# Patient Record
Sex: Female | Born: 1945 | Race: White | Hispanic: No | Smoking: Former smoker
Health system: Southern US, Community
[De-identification: ages and names within clinical notes are randomized; demographics above are authoritative.]

## PROBLEM LIST (undated history)

## (undated) DIAGNOSIS — C50919 Malignant neoplasm of unspecified site of unspecified female breast: Secondary | ICD-10-CM

## (undated) DIAGNOSIS — D494 Neoplasm of unspecified behavior of bladder: Secondary | ICD-10-CM

## (undated) DIAGNOSIS — C801 Malignant (primary) neoplasm, unspecified: Secondary | ICD-10-CM

## (undated) DIAGNOSIS — M5432 Sciatica, left side: Secondary | ICD-10-CM

## (undated) DIAGNOSIS — M5431 Sciatica, right side: Secondary | ICD-10-CM

## (undated) DIAGNOSIS — F5101 Primary insomnia: Secondary | ICD-10-CM

## (undated) DIAGNOSIS — Z853 Personal history of malignant neoplasm of breast: Secondary | ICD-10-CM

## (undated) DIAGNOSIS — Z9889 Other specified postprocedural states: Secondary | ICD-10-CM

## (undated) DIAGNOSIS — R112 Nausea with vomiting, unspecified: Secondary | ICD-10-CM

## (undated) DIAGNOSIS — K219 Gastro-esophageal reflux disease without esophagitis: Secondary | ICD-10-CM

## (undated) DIAGNOSIS — Z9221 Personal history of antineoplastic chemotherapy: Secondary | ICD-10-CM

## (undated) DIAGNOSIS — H04123 Dry eye syndrome of bilateral lacrimal glands: Secondary | ICD-10-CM

## (undated) DIAGNOSIS — Z8719 Personal history of other diseases of the digestive system: Secondary | ICD-10-CM

## (undated) DIAGNOSIS — Z9109 Other allergy status, other than to drugs and biological substances: Secondary | ICD-10-CM

## (undated) DIAGNOSIS — Z923 Personal history of irradiation: Secondary | ICD-10-CM

## (undated) DIAGNOSIS — N329 Bladder disorder, unspecified: Secondary | ICD-10-CM

## (undated) HISTORY — PX: TONSILLECTOMY: SUR1361

## (undated) HISTORY — PX: CATARACT EXTRACTION W/ INTRAOCULAR LENS  IMPLANT, BILATERAL: SHX1307

## (undated) HISTORY — PX: OTHER SURGICAL HISTORY: SHX169

---
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
## 2006-07-15 DIAGNOSIS — Z853 Personal history of malignant neoplasm of breast: Secondary | ICD-10-CM

## 2006-07-15 HISTORY — PX: BREAST LUMPECTOMY: SHX2

## 2006-07-15 HISTORY — DX: Personal history of malignant neoplasm of breast: Z85.3

## 2006-07-24 ENCOUNTER — Encounter (INDEPENDENT_AMBULATORY_CARE_PROVIDER_SITE_OTHER): Payer: Self-pay | Admitting: Radiology

## 2006-07-24 ENCOUNTER — Encounter: Admission: RE | Admit: 2006-07-24 | Discharge: 2006-07-24 | Payer: Self-pay | Admitting: Family Medicine

## 2006-07-24 ENCOUNTER — Encounter (INDEPENDENT_AMBULATORY_CARE_PROVIDER_SITE_OTHER): Payer: Self-pay | Admitting: Specialist

## 2006-08-01 ENCOUNTER — Encounter: Admission: RE | Admit: 2006-08-01 | Discharge: 2006-08-01 | Payer: Self-pay | Admitting: Family Medicine

## 2006-08-11 ENCOUNTER — Encounter: Admission: RE | Admit: 2006-08-11 | Discharge: 2006-08-11 | Payer: Self-pay | Admitting: Surgery

## 2006-08-13 ENCOUNTER — Encounter: Admission: RE | Admit: 2006-08-13 | Discharge: 2006-08-13 | Payer: Self-pay | Admitting: Surgery

## 2006-08-13 ENCOUNTER — Encounter (INDEPENDENT_AMBULATORY_CARE_PROVIDER_SITE_OTHER): Payer: Self-pay | Admitting: Specialist

## 2006-08-13 ENCOUNTER — Ambulatory Visit (HOSPITAL_BASED_OUTPATIENT_CLINIC_OR_DEPARTMENT_OTHER): Admission: RE | Admit: 2006-08-13 | Discharge: 2006-08-13 | Payer: Self-pay | Admitting: Surgery

## 2006-08-13 HISTORY — PX: BREAST LUMPECTOMY WITH NEEDLE LOCALIZATION AND AXILLARY SENTINEL LYMPH NODE BX: SHX5760

## 2006-08-27 ENCOUNTER — Ambulatory Visit: Payer: Self-pay | Admitting: Oncology

## 2006-09-03 LAB — CANCER ANTIGEN 27.29: CA 27.29: 35 U/mL (ref 0–39)

## 2006-09-03 LAB — CBC WITH DIFFERENTIAL/PLATELET
BASO%: 0.4 % (ref 0.0–2.0)
Eosinophils Absolute: 0.3 10*3/uL (ref 0.0–0.5)
LYMPH%: 29.6 % (ref 14.0–48.0)
MCHC: 34.8 g/dL (ref 32.0–36.0)
MCV: 91.3 fL (ref 81.0–101.0)
MONO%: 5.3 % (ref 0.0–13.0)
NEUT%: 62 % (ref 39.6–76.8)
Platelets: 288 10*3/uL (ref 145–400)
RBC: 4.56 10*6/uL (ref 3.70–5.32)

## 2006-09-03 LAB — COMPREHENSIVE METABOLIC PANEL
ALT: 29 U/L (ref 0–35)
AST: 23 U/L (ref 0–37)
Calcium: 10.2 mg/dL (ref 8.4–10.5)
Chloride: 105 mEq/L (ref 96–112)
Creatinine, Ser: 0.76 mg/dL (ref 0.40–1.20)
Potassium: 4.3 mEq/L (ref 3.5–5.3)

## 2006-09-09 ENCOUNTER — Encounter: Admission: RE | Admit: 2006-09-09 | Discharge: 2006-09-09 | Payer: Self-pay | Admitting: Oncology

## 2006-09-10 ENCOUNTER — Encounter: Payer: Self-pay | Admitting: Cardiology

## 2006-09-10 ENCOUNTER — Ambulatory Visit (HOSPITAL_COMMUNITY): Admission: RE | Admit: 2006-09-10 | Discharge: 2006-09-10 | Payer: Self-pay | Admitting: Oncology

## 2006-09-10 ENCOUNTER — Ambulatory Visit: Payer: Self-pay | Admitting: Cardiology

## 2006-09-11 ENCOUNTER — Ambulatory Visit (HOSPITAL_COMMUNITY): Admission: RE | Admit: 2006-09-11 | Discharge: 2006-09-11 | Payer: Self-pay | Admitting: Surgery

## 2006-09-15 LAB — CBC WITH DIFFERENTIAL/PLATELET
Basophils Absolute: 0.1 10*3/uL (ref 0.0–0.1)
EOS%: 2 % (ref 0.0–7.0)
HGB: 14 g/dL (ref 11.6–15.9)
LYMPH%: 26 % (ref 14.0–48.0)
MCH: 31.8 pg (ref 26.0–34.0)
MCV: 91 fL (ref 81.0–101.0)
MONO%: 4.7 % (ref 0.0–13.0)
NEUT%: 66.8 % (ref 39.6–76.8)
RDW: 13.4 % (ref 11.3–14.5)

## 2006-09-15 LAB — COMPREHENSIVE METABOLIC PANEL
AST: 26 U/L (ref 0–37)
Alkaline Phosphatase: 63 U/L (ref 39–117)
BUN: 19 mg/dL (ref 6–23)
Creatinine, Ser: 0.78 mg/dL (ref 0.40–1.20)
Potassium: 4.1 mEq/L (ref 3.5–5.3)
Total Bilirubin: 0.3 mg/dL (ref 0.3–1.2)

## 2006-09-15 LAB — CANCER ANTIGEN 27.29: CA 27.29: 26 U/mL (ref 0–39)

## 2006-09-24 LAB — CBC WITH DIFFERENTIAL/PLATELET
BASO%: 0.4 % (ref 0.0–2.0)
Basophils Absolute: 0 10*3/uL (ref 0.0–0.1)
EOS%: 1.8 % (ref 0.0–7.0)
MCH: 31.4 pg (ref 26.0–34.0)
MCHC: 34.6 g/dL (ref 32.0–36.0)
MCV: 90.8 fL (ref 81.0–101.0)
MONO%: 15.2 % — ABNORMAL HIGH (ref 0.0–13.0)
RBC: 4.72 10*6/uL (ref 3.70–5.32)
RDW: 13 % (ref 11.3–14.5)
lymph#: 2.1 10*3/uL (ref 0.9–3.3)

## 2006-10-06 ENCOUNTER — Ambulatory Visit: Payer: Self-pay | Admitting: Oncology

## 2006-10-08 LAB — CBC WITH DIFFERENTIAL/PLATELET
Eosinophils Absolute: 0 10*3/uL (ref 0.0–0.5)
MCV: 91.8 fL (ref 81.0–101.0)
MONO%: 3.9 % (ref 0.0–13.0)
NEUT#: 10.4 10*3/uL — ABNORMAL HIGH (ref 1.5–6.5)
RBC: 4.49 10*6/uL (ref 3.70–5.32)
RDW: 13.8 % (ref 11.3–14.5)
WBC: 12.6 10*3/uL — ABNORMAL HIGH (ref 3.9–10.0)
lymph#: 1.6 10*3/uL (ref 0.9–3.3)

## 2006-10-16 LAB — MANUAL DIFFERENTIAL
ANC (CHCC manual diff): 6 10*3/uL (ref 1.5–6.5)
Band Neutrophils: 10 % (ref 0–10)
Blasts: 0 % (ref 0–0)
EOS: 2 % (ref 0–7)
Other Cell: 0 % (ref 0–0)
PROMYELO: 0 % (ref 0–0)
SEG: 38 % (ref 38–77)
nRBC: 0 % (ref 0–0)

## 2006-10-16 LAB — CBC WITH DIFFERENTIAL/PLATELET
HCT: 39.4 % (ref 34.8–46.6)
HGB: 13.9 g/dL (ref 11.6–15.9)
MCV: 90.7 fL (ref 81.0–101.0)
RDW: 12 % (ref 11.3–14.5)
WBC: 9.6 10*3/uL (ref 3.9–10.0)

## 2006-10-29 LAB — CBC WITH DIFFERENTIAL/PLATELET
Basophils Absolute: 0 10*3/uL (ref 0.0–0.1)
EOS%: 0.2 % (ref 0.0–7.0)
HCT: 37.4 % (ref 34.8–46.6)
HGB: 13 g/dL (ref 11.6–15.9)
MCH: 32.1 pg (ref 26.0–34.0)
MCHC: 34.8 g/dL (ref 32.0–36.0)
MCV: 92.1 fL (ref 81.0–101.0)
MONO%: 5.9 % (ref 0.0–13.0)
NEUT%: 78.3 % — ABNORMAL HIGH (ref 39.6–76.8)
RDW: 15.1 % — ABNORMAL HIGH (ref 11.3–14.5)

## 2006-10-29 LAB — COMPREHENSIVE METABOLIC PANEL
AST: 22 U/L (ref 0–37)
Alkaline Phosphatase: 66 U/L (ref 39–117)
BUN: 11 mg/dL (ref 6–23)
Creatinine, Ser: 0.62 mg/dL (ref 0.40–1.20)

## 2006-11-06 LAB — CBC WITH DIFFERENTIAL/PLATELET
Basophils Absolute: 0.1 10*3/uL (ref 0.0–0.1)
EOS%: 2.1 % (ref 0.0–7.0)
HCT: 35.1 % (ref 34.8–46.6)
HGB: 12.3 g/dL (ref 11.6–15.9)
LYMPH%: 18.3 % (ref 14.0–48.0)
MCH: 32 pg (ref 26.0–34.0)
MCV: 91.6 fL (ref 81.0–101.0)
MONO%: 16.7 % — ABNORMAL HIGH (ref 0.0–13.0)
NEUT%: 62.2 % (ref 39.6–76.8)
Platelets: 187 10*3/uL (ref 145–400)

## 2006-11-18 ENCOUNTER — Ambulatory Visit: Payer: Self-pay | Admitting: Oncology

## 2006-11-19 LAB — CBC WITH DIFFERENTIAL/PLATELET
Basophils Absolute: 0 10*3/uL (ref 0.0–0.1)
EOS%: 0.1 % (ref 0.0–7.0)
HGB: 12.8 g/dL (ref 11.6–15.9)
MCH: 32.7 pg (ref 26.0–34.0)
MCV: 93.8 fL (ref 81.0–101.0)
MONO%: 7.5 % (ref 0.0–13.0)
RBC: 3.92 10*6/uL (ref 3.70–5.32)
RDW: 16.6 % — ABNORMAL HIGH (ref 11.3–14.5)

## 2006-11-19 LAB — URINALYSIS, MICROSCOPIC - CHCC
Ketones: NEGATIVE mg/dL
Protein: NEGATIVE mg/dL
Specific Gravity, Urine: 1.015 (ref 1.003–1.035)
pH: 5 (ref 4.6–8.0)

## 2006-11-19 LAB — COMPREHENSIVE METABOLIC PANEL
AST: 21 U/L (ref 0–37)
Albumin: 3.8 g/dL (ref 3.5–5.2)
Alkaline Phosphatase: 61 U/L (ref 39–117)
BUN: 9 mg/dL (ref 6–23)
Creatinine, Ser: 0.57 mg/dL (ref 0.40–1.20)
Potassium: 3.8 mEq/L (ref 3.5–5.3)

## 2006-11-24 ENCOUNTER — Ambulatory Visit: Admission: RE | Admit: 2006-11-24 | Discharge: 2007-02-04 | Payer: Self-pay | Admitting: *Deleted

## 2006-11-25 LAB — CBC WITH DIFFERENTIAL/PLATELET
EOS%: 1.9 % (ref 0.0–7.0)
Eosinophils Absolute: 0.1 10*3/uL (ref 0.0–0.5)
MCV: 94.9 fL (ref 81.0–101.0)
MONO%: 1.1 % (ref 0.0–13.0)
NEUT#: 5.8 10*3/uL (ref 1.5–6.5)
RBC: 3.85 10*6/uL (ref 3.70–5.32)
RDW: 16.2 % — ABNORMAL HIGH (ref 11.3–14.5)

## 2006-12-11 LAB — CBC WITH DIFFERENTIAL/PLATELET
Eosinophils Absolute: 0 10*3/uL (ref 0.0–0.5)
MONO#: 0.6 10*3/uL (ref 0.1–0.9)
NEUT#: 6 10*3/uL (ref 1.5–6.5)
Platelets: 318 10*3/uL (ref 145–400)
RBC: 4.1 10*6/uL (ref 3.70–5.32)
RDW: 13.8 % (ref 11.3–14.5)
WBC: 8 10*3/uL (ref 3.9–10.0)
lymph#: 1.3 10*3/uL (ref 0.9–3.3)

## 2006-12-11 LAB — COMPREHENSIVE METABOLIC PANEL
Albumin: 3.7 g/dL (ref 3.5–5.2)
CO2: 25 mEq/L (ref 19–32)
Chloride: 106 mEq/L (ref 96–112)
Glucose, Bld: 83 mg/dL (ref 70–99)
Potassium: 4.1 mEq/L (ref 3.5–5.3)
Sodium: 140 mEq/L (ref 135–145)
Total Protein: 6.5 g/dL (ref 6.0–8.3)

## 2006-12-25 ENCOUNTER — Encounter: Admission: RE | Admit: 2006-12-25 | Discharge: 2006-12-25 | Payer: Self-pay | Admitting: Oncology

## 2007-01-01 ENCOUNTER — Ambulatory Visit: Payer: Self-pay | Admitting: Oncology

## 2007-01-01 LAB — CBC WITH DIFFERENTIAL/PLATELET
BASO%: 0.6 % (ref 0.0–2.0)
Eosinophils Absolute: 0.5 10*3/uL (ref 0.0–0.5)
MONO#: 0.3 10*3/uL (ref 0.1–0.9)
MONO%: 5.6 % (ref 0.0–13.0)
NEUT#: 3.6 10*3/uL (ref 1.5–6.5)
RBC: 4.5 10*6/uL (ref 3.70–5.32)
RDW: 12 % (ref 11.3–14.5)
WBC: 5.7 10*3/uL (ref 3.9–10.0)

## 2007-01-22 LAB — CBC WITH DIFFERENTIAL/PLATELET
Eosinophils Absolute: 0.2 10*3/uL (ref 0.0–0.5)
HCT: 42.7 % (ref 34.8–46.6)
LYMPH%: 21.3 % (ref 14.0–48.0)
MONO#: 0.3 10*3/uL (ref 0.1–0.9)
NEUT#: 4.8 10*3/uL (ref 1.5–6.5)
NEUT%: 69.6 % (ref 39.6–76.8)
Platelets: 200 10*3/uL (ref 145–400)
WBC: 6.9 10*3/uL (ref 3.9–10.0)

## 2007-02-09 ENCOUNTER — Ambulatory Visit: Payer: Self-pay | Admitting: Oncology

## 2007-03-03 ENCOUNTER — Ambulatory Visit: Admission: RE | Admit: 2007-03-03 | Discharge: 2007-03-03 | Payer: Self-pay | Admitting: Oncology

## 2007-03-05 LAB — CBC WITH DIFFERENTIAL/PLATELET
BASO%: 0.9 % (ref 0.0–2.0)
Basophils Absolute: 0.1 10*3/uL (ref 0.0–0.1)
HCT: 42.5 % (ref 34.8–46.6)
LYMPH%: 25.8 % (ref 14.0–48.0)
MCHC: 34.8 g/dL (ref 32.0–36.0)
MONO#: 0.5 10*3/uL (ref 0.1–0.9)
NEUT%: 61.4 % (ref 39.6–76.8)
Platelets: 209 10*3/uL (ref 145–400)
WBC: 6.8 10*3/uL (ref 3.9–10.0)

## 2007-03-26 ENCOUNTER — Ambulatory Visit: Payer: Self-pay | Admitting: Oncology

## 2007-03-26 LAB — CBC WITH DIFFERENTIAL/PLATELET
BASO%: 0.7 % (ref 0.0–2.0)
EOS%: 2.9 % (ref 0.0–7.0)
HCT: 40.2 % (ref 34.8–46.6)
LYMPH%: 23.3 % (ref 14.0–48.0)
MCH: 32.3 pg (ref 26.0–34.0)
MCHC: 35.6 g/dL (ref 32.0–36.0)
MCV: 90.9 fL (ref 81.0–101.0)
MONO%: 5.2 % (ref 0.0–13.0)
NEUT%: 67.9 % (ref 39.6–76.8)
Platelets: 230 10*3/uL (ref 145–400)
lymph#: 1.7 10*3/uL (ref 0.9–3.3)

## 2007-04-16 LAB — CBC WITH DIFFERENTIAL/PLATELET
BASO%: 0.6 % (ref 0.0–2.0)
EOS%: 4.4 % (ref 0.0–7.0)
MCH: 32.3 pg (ref 26.0–34.0)
MCHC: 34.8 g/dL (ref 32.0–36.0)
MCV: 92.8 fL (ref 81.0–101.0)
MONO%: 5.5 % (ref 0.0–13.0)
RBC: 4.27 10*6/uL (ref 3.70–5.32)
RDW: 12.2 % (ref 11.3–14.5)
lymph#: 2 10*3/uL (ref 0.9–3.3)

## 2007-05-07 LAB — COMPREHENSIVE METABOLIC PANEL
AST: 16 U/L (ref 0–37)
Albumin: 4 g/dL (ref 3.5–5.2)
Alkaline Phosphatase: 56 U/L (ref 39–117)
BUN: 15 mg/dL (ref 6–23)
Calcium: 8.9 mg/dL (ref 8.4–10.5)
Chloride: 109 mEq/L (ref 96–112)
Glucose, Bld: 86 mg/dL (ref 70–99)
Potassium: 4.3 mEq/L (ref 3.5–5.3)
Sodium: 143 mEq/L (ref 135–145)
Total Protein: 6.8 g/dL (ref 6.0–8.3)

## 2007-05-07 LAB — CBC WITH DIFFERENTIAL/PLATELET
Basophils Absolute: 0.1 10*3/uL (ref 0.0–0.1)
EOS%: 4.4 % (ref 0.0–7.0)
Eosinophils Absolute: 0.3 10*3/uL (ref 0.0–0.5)
HGB: 13.6 g/dL (ref 11.6–15.9)
MONO%: 5.5 % (ref 0.0–13.0)
NEUT#: 4.1 10*3/uL (ref 1.5–6.5)
RBC: 4.16 10*6/uL (ref 3.70–5.32)
RDW: 11.7 % (ref 11.3–14.5)
lymph#: 1.7 10*3/uL (ref 0.9–3.3)

## 2007-05-26 ENCOUNTER — Ambulatory Visit: Payer: Self-pay | Admitting: Oncology

## 2007-06-03 ENCOUNTER — Encounter: Payer: Self-pay | Admitting: Oncology

## 2007-06-03 ENCOUNTER — Ambulatory Visit: Payer: Self-pay | Admitting: Cardiology

## 2007-06-03 ENCOUNTER — Ambulatory Visit: Admission: RE | Admit: 2007-06-03 | Discharge: 2007-06-03 | Payer: Self-pay | Admitting: Oncology

## 2007-06-18 LAB — CBC WITH DIFFERENTIAL/PLATELET
Basophils Absolute: 0.1 10*3/uL (ref 0.0–0.1)
Eosinophils Absolute: 0.3 10*3/uL (ref 0.0–0.5)
HGB: 13.5 g/dL (ref 11.6–15.9)
MCV: 92.5 fL (ref 81.0–101.0)
MONO#: 0.5 10*3/uL (ref 0.1–0.9)
MONO%: 7.9 % (ref 0.0–13.0)
NEUT#: 3.4 10*3/uL (ref 1.5–6.5)
Platelets: 223 10*3/uL (ref 145–400)
RDW: 11.3 % (ref 11.3–14.5)

## 2007-06-18 LAB — COMPREHENSIVE METABOLIC PANEL
Albumin: 3.9 g/dL (ref 3.5–5.2)
Alkaline Phosphatase: 64 U/L (ref 39–117)
BUN: 21 mg/dL (ref 6–23)
CO2: 24 mEq/L (ref 19–32)
Calcium: 8.9 mg/dL (ref 8.4–10.5)
Glucose, Bld: 91 mg/dL (ref 70–99)
Potassium: 4.5 mEq/L (ref 3.5–5.3)

## 2007-07-06 ENCOUNTER — Ambulatory Visit: Payer: Self-pay | Admitting: Oncology

## 2007-07-30 LAB — COMPREHENSIVE METABOLIC PANEL
Albumin: 4 g/dL (ref 3.5–5.2)
BUN: 17 mg/dL (ref 6–23)
CO2: 23 mEq/L (ref 19–32)
Calcium: 9.2 mg/dL (ref 8.4–10.5)
Chloride: 107 mEq/L (ref 96–112)
Glucose, Bld: 97 mg/dL (ref 70–99)
Potassium: 4.2 mEq/L (ref 3.5–5.3)
Sodium: 140 mEq/L (ref 135–145)
Total Protein: 7 g/dL (ref 6.0–8.3)

## 2007-07-30 LAB — CBC WITH DIFFERENTIAL/PLATELET
Basophils Absolute: 0 10*3/uL (ref 0.0–0.1)
Eosinophils Absolute: 0.4 10*3/uL (ref 0.0–0.5)
HGB: 13.6 g/dL (ref 11.6–15.9)
MCV: 93.4 fL (ref 81.0–101.0)
MONO#: 0.4 10*3/uL (ref 0.1–0.9)
NEUT#: 4 10*3/uL (ref 1.5–6.5)
RDW: 11.4 % (ref 11.3–14.5)
WBC: 6.6 10*3/uL (ref 3.9–10.0)
lymph#: 1.7 10*3/uL (ref 0.9–3.3)

## 2007-08-06 ENCOUNTER — Encounter: Admission: RE | Admit: 2007-08-06 | Discharge: 2007-08-06 | Payer: Self-pay | Admitting: Oncology

## 2007-08-13 LAB — CBC WITH DIFFERENTIAL/PLATELET
Basophils Absolute: 0 10*3/uL (ref 0.0–0.1)
EOS%: 5 % (ref 0.0–7.0)
MCH: 32 pg (ref 26.0–34.0)
MCHC: 34.1 g/dL (ref 32.0–36.0)
MCV: 93.9 fL (ref 81.0–101.0)
MONO%: 6.4 % (ref 0.0–13.0)
RBC: 4.25 10*6/uL (ref 3.70–5.32)
RDW: 13.4 % (ref 11.3–14.5)

## 2007-08-13 LAB — CANCER ANTIGEN 27.29: CA 27.29: 25 U/mL (ref 0–39)

## 2007-08-18 ENCOUNTER — Ambulatory Visit: Payer: Self-pay | Admitting: Oncology

## 2007-08-20 LAB — CBC WITH DIFFERENTIAL/PLATELET
Basophils Absolute: 0.1 10*3/uL (ref 0.0–0.1)
EOS%: 6.2 % (ref 0.0–7.0)
HGB: 13.3 g/dL (ref 11.6–15.9)
MCH: 31.5 pg (ref 26.0–34.0)
MCV: 92.3 fL (ref 81.0–101.0)
MONO%: 6.3 % (ref 0.0–13.0)
NEUT%: 58.4 % (ref 39.6–76.8)
RDW: 10.9 % — ABNORMAL LOW (ref 11.3–14.5)

## 2007-08-20 LAB — COMPREHENSIVE METABOLIC PANEL
AST: 17 U/L (ref 0–37)
Alkaline Phosphatase: 56 U/L (ref 39–117)
BUN: 19 mg/dL (ref 6–23)
Creatinine, Ser: 0.73 mg/dL (ref 0.40–1.20)
Potassium: 4.2 mEq/L (ref 3.5–5.3)

## 2007-09-10 ENCOUNTER — Encounter: Payer: Self-pay | Admitting: Oncology

## 2007-09-10 ENCOUNTER — Ambulatory Visit: Admission: RE | Admit: 2007-09-10 | Discharge: 2007-09-10 | Payer: Self-pay | Admitting: Oncology

## 2007-09-10 LAB — CBC WITH DIFFERENTIAL/PLATELET
Basophils Absolute: 0 10*3/uL (ref 0.0–0.1)
Eosinophils Absolute: 0.3 10*3/uL (ref 0.0–0.5)
HCT: 41 % (ref 34.8–46.6)
HGB: 14.4 g/dL (ref 11.6–15.9)
LYMPH%: 24.9 % (ref 14.0–48.0)
MCV: 92.9 fL (ref 81.0–101.0)
MONO%: 5.7 % (ref 0.0–13.0)
NEUT#: 4.6 10*3/uL (ref 1.5–6.5)
Platelets: 223 10*3/uL (ref 145–400)

## 2007-09-11 ENCOUNTER — Encounter: Admission: RE | Admit: 2007-09-11 | Discharge: 2007-09-11 | Payer: Self-pay | Admitting: Oncology

## 2007-10-01 ENCOUNTER — Ambulatory Visit: Payer: Self-pay | Admitting: Oncology

## 2007-10-01 LAB — CBC WITH DIFFERENTIAL/PLATELET
Basophils Absolute: 0.1 10*3/uL (ref 0.0–0.1)
Eosinophils Absolute: 0.3 10*3/uL (ref 0.0–0.5)
HGB: 14.1 g/dL (ref 11.6–15.9)
MCV: 95.3 fL (ref 81.0–101.0)
MONO%: 6.2 % (ref 0.0–13.0)
NEUT#: 4.3 10*3/uL (ref 1.5–6.5)
RBC: 4.41 10*6/uL (ref 3.70–5.32)
RDW: 13 % (ref 11.3–14.5)
WBC: 7.2 10*3/uL (ref 3.9–10.0)
lymph#: 2.1 10*3/uL (ref 0.9–3.3)

## 2007-10-22 LAB — CBC WITH DIFFERENTIAL/PLATELET
BASO%: 0.9 % (ref 0.0–2.0)
Basophils Absolute: 0.1 10*3/uL (ref 0.0–0.1)
EOS%: 5.4 % (ref 0.0–7.0)
Eosinophils Absolute: 0.4 10*3/uL (ref 0.0–0.5)
HCT: 40.2 % (ref 34.8–46.6)
HGB: 13.6 g/dL (ref 11.6–15.9)
LYMPH%: 24.5 % (ref 14.0–48.0)
MCH: 32.3 pg (ref 26.0–34.0)
MCHC: 33.8 g/dL (ref 32.0–36.0)
MCV: 95.6 fL (ref 81.0–101.0)
MONO#: 0.5 10*3/uL (ref 0.1–0.9)
MONO%: 6.5 % (ref 0.0–13.0)
NEUT#: 4.5 10*3/uL (ref 1.5–6.5)
NEUT%: 62.7 % (ref 39.6–76.8)
Platelets: 220 10*3/uL (ref 145–400)
RBC: 4.21 10*6/uL (ref 3.70–5.32)
RDW: 12.7 % (ref 11.3–14.5)
WBC: 7.2 10*3/uL (ref 3.9–10.0)
lymph#: 1.8 10*3/uL (ref 0.9–3.3)

## 2007-10-22 LAB — COMPREHENSIVE METABOLIC PANEL
ALT: 15 U/L (ref 0–35)
AST: 18 U/L (ref 0–37)
BUN: 23 mg/dL (ref 6–23)
CO2: 24 mEq/L (ref 19–32)
Calcium: 8.8 mg/dL (ref 8.4–10.5)
Creatinine, Ser: 0.77 mg/dL (ref 0.40–1.20)
Total Bilirubin: 0.4 mg/dL (ref 0.3–1.2)

## 2007-10-22 LAB — CANCER ANTIGEN 27.29: CA 27.29: 27 U/mL (ref 0–39)

## 2007-11-09 ENCOUNTER — Ambulatory Visit: Payer: Self-pay | Admitting: Oncology

## 2007-11-12 LAB — CBC WITH DIFFERENTIAL/PLATELET
Basophils Absolute: 0 10*3/uL (ref 0.0–0.1)
EOS%: 5.6 % (ref 0.0–7.0)
Eosinophils Absolute: 0.3 10*3/uL (ref 0.0–0.5)
HCT: 37.8 % (ref 34.8–46.6)
HGB: 13.2 g/dL (ref 11.6–15.9)
MCH: 32.5 pg (ref 26.0–34.0)
MONO#: 0.4 10*3/uL (ref 0.1–0.9)
NEUT#: 3.1 10*3/uL (ref 1.5–6.5)
NEUT%: 52.7 % (ref 39.6–76.8)
RDW: 13.4 % (ref 11.3–14.5)
lymph#: 2 10*3/uL (ref 0.9–3.3)

## 2007-11-12 LAB — COMPREHENSIVE METABOLIC PANEL
AST: 17 U/L (ref 0–37)
Albumin: 4 g/dL (ref 3.5–5.2)
BUN: 20 mg/dL (ref 6–23)
CO2: 24 mEq/L (ref 19–32)
Calcium: 9.4 mg/dL (ref 8.4–10.5)
Chloride: 107 mEq/L (ref 96–112)
Creatinine, Ser: 0.79 mg/dL (ref 0.40–1.20)
Glucose, Bld: 83 mg/dL (ref 70–99)
Potassium: 4.1 mEq/L (ref 3.5–5.3)

## 2007-11-12 LAB — CANCER ANTIGEN 27.29: CA 27.29: 26 U/mL (ref 0–39)

## 2007-12-18 ENCOUNTER — Ambulatory Visit (HOSPITAL_BASED_OUTPATIENT_CLINIC_OR_DEPARTMENT_OTHER): Admission: RE | Admit: 2007-12-18 | Discharge: 2007-12-18 | Payer: Self-pay | Admitting: Surgery

## 2008-04-04 ENCOUNTER — Ambulatory Visit: Payer: Self-pay | Admitting: Oncology

## 2008-04-06 ENCOUNTER — Encounter: Payer: Self-pay | Admitting: Oncology

## 2008-04-06 ENCOUNTER — Ambulatory Visit: Admission: RE | Admit: 2008-04-06 | Discharge: 2008-04-06 | Payer: Self-pay | Admitting: Oncology

## 2008-04-06 ENCOUNTER — Ambulatory Visit: Payer: Self-pay | Admitting: Cardiology

## 2008-04-06 HISTORY — PX: TRANSTHORACIC ECHOCARDIOGRAM: SHX275

## 2008-04-06 LAB — COMPREHENSIVE METABOLIC PANEL
ALT: 19 U/L (ref 0–35)
AST: 23 U/L (ref 0–37)
Creatinine, Ser: 0.68 mg/dL (ref 0.40–1.20)
Total Bilirubin: 0.5 mg/dL (ref 0.3–1.2)

## 2008-04-06 LAB — CBC WITH DIFFERENTIAL/PLATELET
BASO%: 0.4 % (ref 0.0–2.0)
EOS%: 2.7 % (ref 0.0–7.0)
HCT: 41.3 % (ref 34.8–46.6)
LYMPH%: 27.8 % (ref 14.0–48.0)
MCH: 32.7 pg (ref 26.0–34.0)
MCHC: 34.5 g/dL (ref 32.0–36.0)
MCV: 94.7 fL (ref 81.0–101.0)
MONO%: 6 % (ref 0.0–13.0)
NEUT%: 63.1 % (ref 39.6–76.8)
Platelets: 194 10*3/uL (ref 145–400)
lymph#: 1.9 10*3/uL (ref 0.9–3.3)

## 2008-07-22 ENCOUNTER — Ambulatory Visit: Payer: Self-pay | Admitting: Oncology

## 2008-07-26 LAB — COMPREHENSIVE METABOLIC PANEL
Alkaline Phosphatase: 44 U/L (ref 39–117)
CO2: 24 mEq/L (ref 19–32)
Creatinine, Ser: 0.65 mg/dL (ref 0.40–1.20)
Glucose, Bld: 100 mg/dL — ABNORMAL HIGH (ref 70–99)
Sodium: 136 mEq/L (ref 135–145)
Total Bilirubin: 0.7 mg/dL (ref 0.3–1.2)

## 2008-07-26 LAB — CBC WITH DIFFERENTIAL/PLATELET
BASO%: 0.2 % (ref 0.0–2.0)
Eosinophils Absolute: 0.1 10*3/uL (ref 0.0–0.5)
HCT: 40.7 % (ref 34.8–46.6)
LYMPH%: 26.2 % (ref 14.0–48.0)
MCHC: 34.4 g/dL (ref 32.0–36.0)
MCV: 94.4 fL (ref 81.0–101.0)
MONO#: 0.4 10*3/uL (ref 0.1–0.9)
MONO%: 5.1 % (ref 0.0–13.0)
NEUT%: 66.6 % (ref 39.6–76.8)
Platelets: 209 10*3/uL (ref 145–400)
RBC: 4.32 10*6/uL (ref 3.70–5.32)
WBC: 7.1 10*3/uL (ref 3.9–10.0)

## 2008-07-26 LAB — CANCER ANTIGEN 27.29: CA 27.29: 19 U/mL (ref 0–39)

## 2008-09-19 ENCOUNTER — Encounter: Admission: RE | Admit: 2008-09-19 | Discharge: 2008-09-19 | Payer: Self-pay | Admitting: Oncology

## 2008-12-30 ENCOUNTER — Ambulatory Visit: Payer: Self-pay | Admitting: Oncology

## 2009-01-03 LAB — CBC WITH DIFFERENTIAL/PLATELET
BASO%: 0.5 % (ref 0.0–2.0)
EOS%: 2.8 % (ref 0.0–7.0)
HGB: 14.3 g/dL (ref 11.6–15.9)
MCH: 33.2 pg (ref 25.1–34.0)
MCHC: 35.1 g/dL (ref 31.5–36.0)
MCV: 94.8 fL (ref 79.5–101.0)
MONO%: 5.6 % (ref 0.0–14.0)
RBC: 4.3 10*6/uL (ref 3.70–5.45)
RDW: 13.6 % (ref 11.2–14.5)
lymph#: 1.8 10*3/uL (ref 0.9–3.3)

## 2009-01-04 LAB — COMPREHENSIVE METABOLIC PANEL
ALT: 13 U/L (ref 0–35)
AST: 16 U/L (ref 0–37)
Albumin: 4.2 g/dL (ref 3.5–5.2)
Alkaline Phosphatase: 49 U/L (ref 39–117)
BUN: 18 mg/dL (ref 6–23)
Calcium: 9.5 mg/dL (ref 8.4–10.5)
Chloride: 105 mEq/L (ref 96–112)
Potassium: 4.6 mEq/L (ref 3.5–5.3)
Sodium: 139 mEq/L (ref 135–145)

## 2009-06-22 ENCOUNTER — Ambulatory Visit (HOSPITAL_COMMUNITY): Admission: RE | Admit: 2009-06-22 | Discharge: 2009-06-22 | Payer: Self-pay | Admitting: Obstetrics and Gynecology

## 2009-06-22 HISTORY — PX: HYSTEROSCOPY WITH D & C: SHX1775

## 2009-07-20 ENCOUNTER — Ambulatory Visit: Payer: Self-pay | Admitting: Oncology

## 2009-08-21 ENCOUNTER — Other Ambulatory Visit: Payer: Self-pay | Admitting: Oncology

## 2009-08-21 ENCOUNTER — Ambulatory Visit: Payer: Self-pay | Admitting: Oncology

## 2009-08-21 LAB — CBC WITH DIFFERENTIAL/PLATELET
BASO%: 0.3 % (ref 0.0–2.0)
HCT: 43.4 % (ref 34.8–46.6)
HGB: 15.1 g/dL (ref 11.6–15.9)
MCH: 33.1 pg (ref 25.1–34.0)
MCHC: 34.7 g/dL (ref 31.5–36.0)
MCV: 95.4 fL (ref 79.5–101.0)
RBC: 4.55 10*6/uL (ref 3.70–5.45)
WBC: 9.6 10*3/uL (ref 3.9–10.3)
lymph#: 2.4 10*3/uL (ref 0.9–3.3)

## 2009-08-21 LAB — COMPREHENSIVE METABOLIC PANEL
AST: 25 U/L (ref 0–37)
BUN: 15 mg/dL (ref 6–23)
CO2: 29 mEq/L (ref 19–32)
Chloride: 101 mEq/L (ref 96–112)
Potassium: 4.5 mEq/L (ref 3.5–5.3)
Sodium: 136 mEq/L (ref 135–145)

## 2009-09-20 ENCOUNTER — Encounter: Admission: RE | Admit: 2009-09-20 | Discharge: 2009-09-20 | Payer: Self-pay | Admitting: Oncology

## 2010-02-15 ENCOUNTER — Ambulatory Visit: Payer: Self-pay | Admitting: Oncology

## 2010-02-19 LAB — CBC WITH DIFFERENTIAL/PLATELET
EOS%: 1.5 % (ref 0.0–7.0)
HCT: 44.4 % (ref 34.8–46.6)
HGB: 14.9 g/dL (ref 11.6–15.9)
MCHC: 33.5 g/dL (ref 31.5–36.0)
MCV: 96 fL (ref 79.5–101.0)
MONO#: 0.4 10*3/uL (ref 0.1–0.9)
NEUT#: 4.3 10*3/uL (ref 1.5–6.5)
NEUT%: 65 % (ref 38.4–76.8)
Platelets: 201 10*3/uL (ref 145–400)
RBC: 4.62 10*6/uL (ref 3.70–5.45)
WBC: 6.6 10*3/uL (ref 3.9–10.3)

## 2010-02-20 LAB — COMPREHENSIVE METABOLIC PANEL
BUN: 18 mg/dL (ref 6–23)
Chloride: 104 mEq/L (ref 96–112)
Glucose, Bld: 85 mg/dL (ref 70–99)
Potassium: 4.2 mEq/L (ref 3.5–5.3)
Sodium: 138 mEq/L (ref 135–145)
Total Protein: 6.8 g/dL (ref 6.0–8.3)

## 2010-08-05 ENCOUNTER — Encounter: Payer: Self-pay | Admitting: Oncology

## 2010-08-23 ENCOUNTER — Other Ambulatory Visit: Payer: Self-pay | Admitting: Oncology

## 2010-08-23 ENCOUNTER — Encounter (HOSPITAL_BASED_OUTPATIENT_CLINIC_OR_DEPARTMENT_OTHER): Payer: Medicare Other | Admitting: Oncology

## 2010-08-23 DIAGNOSIS — C50519 Malignant neoplasm of lower-outer quadrant of unspecified female breast: Secondary | ICD-10-CM

## 2010-08-23 DIAGNOSIS — Z170 Estrogen receptor positive status [ER+]: Secondary | ICD-10-CM

## 2010-08-23 LAB — COMPREHENSIVE METABOLIC PANEL
ALT: 16 U/L (ref 0–35)
Calcium: 9 mg/dL (ref 8.4–10.5)
Chloride: 105 mEq/L (ref 96–112)
Creatinine, Ser: 0.76 mg/dL (ref 0.40–1.20)
Glucose, Bld: 101 mg/dL — ABNORMAL HIGH (ref 70–99)
Sodium: 139 mEq/L (ref 135–145)
Total Bilirubin: 0.4 mg/dL (ref 0.3–1.2)

## 2010-08-23 LAB — CBC WITH DIFFERENTIAL/PLATELET
BASO%: 0.2 % (ref 0.0–2.0)
MONO#: 0.5 10*3/uL (ref 0.1–0.9)
NEUT#: 5 10*3/uL (ref 1.5–6.5)
NEUT%: 60.9 % (ref 38.4–76.8)
RBC: 4.55 10*6/uL (ref 3.70–5.45)
WBC: 8.3 10*3/uL (ref 3.9–10.3)

## 2010-08-27 ENCOUNTER — Other Ambulatory Visit: Payer: Self-pay | Admitting: Oncology

## 2010-08-27 DIAGNOSIS — Z9889 Other specified postprocedural states: Secondary | ICD-10-CM

## 2010-09-25 ENCOUNTER — Ambulatory Visit
Admission: RE | Admit: 2010-09-25 | Discharge: 2010-09-25 | Disposition: A | Discharge status: Home / Self Care | Payer: Medicare Other | Source: Ambulatory Visit | Attending: Oncology | Admitting: Oncology

## 2010-09-25 DIAGNOSIS — Z9889 Other specified postprocedural states: Secondary | ICD-10-CM

## 2010-10-16 LAB — CBC
HCT: 44.2 % (ref 36.0–46.0)
MCHC: 33.5 g/dL (ref 30.0–36.0)
Platelets: 222 10*3/uL (ref 150–400)
RDW: 14.1 % (ref 11.5–15.5)

## 2010-11-27 NOTE — Op Note (Signed)
NAMEBABBETTE, Whitney Boyd               ACCOUNT NO.:  [id number]   MEDICAL RECORD NO.:  [id number]          PATIENT TYPE:  AMB   LOCATION:  DSC                          FACILITY:  [facility]   PHYSICIAN:  Thomas A. Cornett, M.D.DATE OF BIRTH:  [birth year]   DATE OF PROCEDURE:  12/18/2007  DATE OF DISCHARGE:                               OPERATIVE REPORT   PREOPERATIVE DIAGNOSIS:  History of breast cancer status post  chemotherapy with left subclavian Port-a-Cath.   POSTOPERATIVE DIAGNOSIS:  History of breast cancer status post  chemotherapy with left subclavian Port-a-Cath.   PROCEDURE:  Explantation left subclavian Port-a-Cath.   SURGEON:  Maisie Fus A. Cornett, MD   ANESTHESIA:  MAC with 0.25% Sensorcaine local.   ESTIMATED BLOOD LOSS:  Nil.   SPECIMEN:  Left Port-a-Cath to pathology removed intact.   INDICATIONS FOR PROCEDURE:  The patient is a 65 year old female status  post chemotherapy for breast cancer.  A Port-a-Cath is going to be  removed today since her therapy is done.   DESCRIPTION OF PROCEDURE:  The patient was brought to the operating  room.  After induction of MAC anesthesia, the left subclavian region was  prepped and draped in a sterile fashion.  Sensorcaine 0.25% was used.  The previous incision was opened and the catheter itself was grasped.  Finger was placed over the tract and the catheter was removed.  Pursestring suture of 3-0 Vicryl was placed to close the tract.  Remainder of the Port-a-Cath was explanted.  The wound was closed in  layers with a deep layer of 3-0 Vicryl and 4-0 Monocryl in a  subcuticular stitch.  Dermabond was applied.  All final counts of  sponge, needle and instruments were found to be correct at this portion  of the case.  The patient was awoke and taken to recovery in  satisfactory condition.      Thomas A. Cornett, M.D.  Electronically Signed     TAC/MEDQ  D:  12/18/2007  T:  12/19/2007  Job:  [id number]   cc:   Valentino Hue. Magrinat,  M.D.

## 2010-11-30 NOTE — Op Note (Signed)
Whitney Boyd, Whitney Boyd               ACCOUNT NO.:  [id number]   MEDICAL RECORD NO.:  [id number]          PATIENT TYPE:  AMB   LOCATION:  DSC                          FACILITY:  [facility]   PHYSICIAN:  Thomas A. Cornett, M.D.DATE OF BIRTH:  12/26/1945   DATE OF PROCEDURE:  08/13/2006  DATE OF DISCHARGE:                               OPERATIVE REPORT   PREOPERATIVE DIAGNOSIS:  Right breast cancer.   POSTOPERATIVE DIAGNOSIS:  Right breast cancer.   PROCEDURES:  1. Right breast needle-localized quadrantectomy.  2. Right sentinel lymph node mapping with injection of methylene blue      dye.   SURGEON:  Maisie Fus A. Cornett, M.D.   ANESTHESIA:  LMA with approximately mL of 0.25% Sensorcaine local.   ESTIMATED BLOOD LOSS:  30 mL.   SPECIMENS:  1. Right breast mass with localizing wire to pathology.  2. Margins of lumpectomy cavity to pathology with suture on new      surgical margin.  3. Two sentinel lymph nodes from the right axilla.  These were both      blue and hot and negative by touch prep.   DRAINS:  None.   ESTIMATED BLOOD LOSS:  About 30 mL.   INDICATIONS FOR PROCEDURE:  The patient is a 65 year old female who was  found to have a large right breast mass.  Core biopsy revealed this to  be consistent with invasive mammary carcinoma.  We discussed options,  which included neoadjuvant chemotherapy, mastectomy versus lumpectomy.  She really wanted to proceed with lumpectomy.  There was some question  of the size this could be done, but I felt we had a legitimate shot of  doing it this way without neoadjuvant, and she had no interest in  neoadjuvant to begin with.  We went ahead to proceed at this point in  time.   DESCRIPTION OF PROCEDURE:  The patient was brought to the operating room  after undergoing right breast needle localization and injection of  technetium sulfur colloid.  After induction of general anesthesia, the  right breast and right axilla prepped and draped in a  sterile fashion.  NeoProbe was used to localize the area of increased activity in the  right axilla.  I used local anesthesia and injected this in the right  axilla.  Incision was made.  Dissection was carried down into the right  axillary contents.  I localized two sentinel nodes.  Both were blue and  hot, sent to pathology, and were negative by touch prep.  Background  activity in the axilla was almost zero and was much less than 10% of the  main tumor counts.  Next I turned my attention to the lumpectomy.  Incision was made around the localizing wires after infiltration of  local anesthesia.  I used a scalpel to dissect around the mass  circumferentially.  This was done, but the mass was underneath the  nipple and was actually extending up to the nipple.  There was a plane  of tissue between the mass and the undersurface of the nipple that I was  able to get in between  to get sufficient margins, it felt like grossly.  I was able to dissect this out off the undersurface of the nipple and  then the deep margin was quite easy to get.  Medial and lateral margins  were also felt to be adequate as well as the inferior margin.  The  entire tumor came out with the wire.  It was marked and sent to  radiology and the tumor and clip were verified in the specimen.  At this  point time went ahead and re-excised all margins just because this was  relatively close and all margins were re-excised and tagged with a  suture.  At this point, the cavity appeared hemostatic.  I then  approximated some of the remaining breast tissue to close down the  cavity with 3-0 Vicryl.  Monocryl 4-0 was used to approximate the skin.  There was a slight indentation of the breast from the lumpectomy cavity.  There was still some ecchymosis from her biopsy as well.  The axilla was  closed in layers using a 3-0 Vicryl and 4-0 Monocryl.  Steri-Strips and  dry dressings were applied.  All final counts of sponge, needle and   instruments were found be correct for this portion of the case.  The  patient was awoken and taken to recovery in satisfactory condition.      Thomas A. Cornett, M.D.  Electronically Signed     TAC/MEDQ  D:  08/13/2006  T:  08/13/2006  Job:  [id number]

## 2010-11-30 NOTE — Op Note (Signed)
NAMEHAILLEY, Whitney Boyd               ACCOUNT NO.:  [id number]   MEDICAL RECORD NO.:  [id number]          PATIENT TYPE:  AMB   LOCATION:  DAY                          FACILITY:  [facility]   PHYSICIAN:  Thomas A. Cornett, M.D.DATE OF BIRTH:  08-26-1945   DATE OF PROCEDURE:  09/11/2006  DATE OF DISCHARGE:                               OPERATIVE REPORT   PREOPERATIVE DIAGNOSIS:  Poor venous access and right breast cancer.   POSTOP DIAGNOSIS:  Poor venous access and right breast cancer.   PROCEDURE:  Placement of left subclavian, 8-French Port-A-Cath with  fluoroscopy.   SURGEON:  Maisie Fus A. Cornett, M.D.   ANESTHESIA:  MAC with approximately 20 mL of 0.25% Sensorcaine with  epinephrine.   ESTIMATED BLOOD LOSS:  50 mL.   DRAINS:  None.   FINDINGS:  Tip of catheter at junction of SVC and right atrium by  fluoroscopy.   INDICATIONS FOR PROCEDURE:  The patient is a 65 year old female recently  diagnosed with breast cancer.  She needs a Port-A-Cath for chemotherapy.  She presents today for that, after discussion of the port, potential  complications, and long-term expectations.   DESCRIPTION OF PROCEDURE:  The patient was brought to the operating room  and placed supine..  Both arms are tucked.  The upper chest and neck  region were prepped and draped in a sterile fashion.  The left  subclavian region was chosen since she had a right-sided breast cancer.  After sterile prep and drape the patient was induced with MAC  anesthesia.  She was placed in Trendelenburg.  Local anesthesia was  injected just below the left clavicle.  The left subclavian vein was  then cannulated difficulty and wire was fed through this without  difficulty.  Fluoroscopy showed the wire going down the superior vena  cava.   Next, a small incision was made next to the wire.  This area was dilated  with a hemostat.  Incision was made just below the wire for a pocket for  the port.  This was taken all the way into  the chest wall; and a small  pocket was made my finger.  An 8-French Bard unattached port was used;  and this was attached to the actual port.  The catheter and port were  flushed together.  I then used a tunneling device to pull the ports from  the lower incision to the upper incision.  The port was then placed in  the pocket without difficulty.  I cut the catheter to about 23 cm,  approximately 19 of it was from the insertion site.   I then put the patient back in Trendelenburg.  We passed a dilator over  the wire moving the wire to-and-fro without resistance.  We then placed  a dilator introducer over the wire moving the entire complex slowly; and  the wire to-and-fro without any resistance until it was in place.  I  then pulled the dilating wire out.  The catheter was placed and the peel-  away sheath; and the peel-away sheath was peeled away without  difficulty.  Fluoroscopy initially showed  the tip to be in the right  atrium; and I pulled the catheter back about 3 cm.  Fluoroscopy then  showed it to be the junction of the right atrium, and the superior vena  cava.  A true back dart bleed without difficulty; and was flushed with  heparinized saline.  I then put 5 mL of 100 units/mL of heparinized  saline into the port itself.  The port was secured to the chest wall  with 2-0 Prolene.  Then 3-0 Vicryl was used to close the subcutaneous  tissues, and 4-0 Monocryl was used to close in a subcuticular fashion.  The patient tolerated the procedure well.  All final counts were correct  of sponge, needle, and instruments.  She was taken to recovery in  satisfactory condition.  Chest x-ray will be obtained.      Thomas A. Cornett, M.D.  Electronically Signed     TAC/MEDQ  D:  09/11/2006  T:  09/11/2006  Job:  [id number]

## 2011-02-22 ENCOUNTER — Other Ambulatory Visit: Payer: Self-pay | Admitting: Oncology

## 2011-02-22 ENCOUNTER — Encounter (HOSPITAL_BASED_OUTPATIENT_CLINIC_OR_DEPARTMENT_OTHER): Payer: Medicare Other | Admitting: Oncology

## 2011-02-22 DIAGNOSIS — Z170 Estrogen receptor positive status [ER+]: Secondary | ICD-10-CM

## 2011-02-22 DIAGNOSIS — C50519 Malignant neoplasm of lower-outer quadrant of unspecified female breast: Secondary | ICD-10-CM

## 2011-02-22 LAB — CBC WITH DIFFERENTIAL/PLATELET
Basophils Absolute: 0 10*3/uL (ref 0.0–0.1)
Eosinophils Absolute: 0.2 10*3/uL (ref 0.0–0.5)
HCT: 43.4 % (ref 34.8–46.6)
HGB: 14.8 g/dL (ref 11.6–15.9)
MCH: 32.5 pg (ref 25.1–34.0)
MCV: 95.3 fL (ref 79.5–101.0)
MONO%: 6.1 % (ref 0.0–14.0)
NEUT#: 4.8 10*3/uL (ref 1.5–6.5)
NEUT%: 63.2 % (ref 38.4–76.8)
RDW: 13.8 % (ref 11.2–14.5)
lymph#: 2.1 10*3/uL (ref 0.9–3.3)

## 2011-04-11 LAB — POCT HEMOGLOBIN-HEMACUE: Hemoglobin: 13.2

## 2011-08-03 ENCOUNTER — Telehealth: Payer: Self-pay | Admitting: Oncology

## 2011-08-03 NOTE — Telephone Encounter (Signed)
Converted time of 2/14 appt to 9:30 am. S/w pt she is aware.

## 2011-08-19 ENCOUNTER — Emergency Department (HOSPITAL_COMMUNITY): Payer: Medicare Other

## 2011-08-19 ENCOUNTER — Encounter (HOSPITAL_COMMUNITY): Payer: Self-pay | Admitting: Emergency Medicine

## 2011-08-19 ENCOUNTER — Emergency Department (HOSPITAL_COMMUNITY)
Admission: EM | Admit: 2011-08-19 | Discharge: 2011-08-20 | Disposition: A | Discharge status: Home / Self Care | Payer: Medicare Other | Attending: Emergency Medicine | Admitting: Emergency Medicine

## 2011-08-19 ENCOUNTER — Other Ambulatory Visit: Payer: Self-pay

## 2011-08-19 DIAGNOSIS — N390 Urinary tract infection, site not specified: Secondary | ICD-10-CM

## 2011-08-19 DIAGNOSIS — Z7982 Long term (current) use of aspirin: Secondary | ICD-10-CM | POA: Insufficient documentation

## 2011-08-19 DIAGNOSIS — R10819 Abdominal tenderness, unspecified site: Secondary | ICD-10-CM | POA: Insufficient documentation

## 2011-08-19 DIAGNOSIS — R109 Unspecified abdominal pain: Secondary | ICD-10-CM | POA: Insufficient documentation

## 2011-08-19 DIAGNOSIS — F17200 Nicotine dependence, unspecified, uncomplicated: Secondary | ICD-10-CM | POA: Insufficient documentation

## 2011-08-19 DIAGNOSIS — Z79899 Other long term (current) drug therapy: Secondary | ICD-10-CM | POA: Insufficient documentation

## 2011-08-19 DIAGNOSIS — R630 Anorexia: Secondary | ICD-10-CM | POA: Insufficient documentation

## 2011-08-19 DIAGNOSIS — R197 Diarrhea, unspecified: Secondary | ICD-10-CM | POA: Insufficient documentation

## 2011-08-19 DIAGNOSIS — R110 Nausea: Secondary | ICD-10-CM | POA: Insufficient documentation

## 2011-08-19 DIAGNOSIS — Z853 Personal history of malignant neoplasm of breast: Secondary | ICD-10-CM | POA: Insufficient documentation

## 2011-08-19 DIAGNOSIS — M546 Pain in thoracic spine: Secondary | ICD-10-CM | POA: Insufficient documentation

## 2011-08-19 LAB — DIFFERENTIAL
Basophils Relative: 0 % (ref 0–1)
Eosinophils Absolute: 0.2 10*3/uL (ref 0.0–0.7)
Monocytes Absolute: 0.6 10*3/uL (ref 0.1–1.0)
Monocytes Relative: 6 % (ref 3–12)
Neutro Abs: 5.8 10*3/uL (ref 1.7–7.7)

## 2011-08-19 LAB — CBC
HCT: 43.2 % (ref 36.0–46.0)
Hemoglobin: 14.7 g/dL (ref 12.0–15.0)
MCH: 31.7 pg (ref 26.0–34.0)
MCHC: 34 g/dL (ref 30.0–36.0)
MCV: 93.3 fL (ref 78.0–100.0)

## 2011-08-19 LAB — COMPREHENSIVE METABOLIC PANEL
Albumin: 4 g/dL (ref 3.5–5.2)
BUN: 13 mg/dL (ref 6–23)
Chloride: 104 mEq/L (ref 96–112)
Creatinine, Ser: 0.64 mg/dL (ref 0.50–1.10)
Total Bilirubin: 0.3 mg/dL (ref 0.3–1.2)

## 2011-08-19 LAB — LIPASE, BLOOD: Lipase: 38 U/L (ref 11–59)

## 2011-08-19 MED ORDER — HYDROMORPHONE HCL PF 1 MG/ML IJ SOLN
1.0000 mg | Freq: Once | INTRAMUSCULAR | Status: AC
  Administered 2011-08-19: 1 mg via INTRAVENOUS
  Filled 2011-08-19: qty 1

## 2011-08-19 MED ORDER — ONDANSETRON HCL 4 MG/2ML IJ SOLN
4.0000 mg | Freq: Once | INTRAMUSCULAR | Status: AC
  Administered 2011-08-19: 4 mg via INTRAVENOUS
  Filled 2011-08-19: qty 2

## 2011-08-19 MED ORDER — SODIUM CHLORIDE 0.9 % IV BOLUS (SEPSIS)
1000.0000 mL | Freq: Once | INTRAVENOUS | Status: AC
  Administered 2011-08-19: 1000 mL via INTRAVENOUS

## 2011-08-19 NOTE — ED Notes (Signed)
Pt c/o mid back pain and mid abd pain onset last pm.  St's she had nausea and diarrhea last pm.  St's she continues to feel nauseated

## 2011-08-19 NOTE — ED Notes (Signed)
Patient went to bathroom......she says when she goes back we can obtain urine.

## 2011-08-19 NOTE — ED Notes (Signed)
The pt cannot void she recently went  To the br before she was told a spec was needed.  C/o nausea.  1000cc nss added to iv

## 2011-08-20 ENCOUNTER — Emergency Department (HOSPITAL_COMMUNITY): Payer: Medicare Other

## 2011-08-20 LAB — URINALYSIS, ROUTINE W REFLEX MICROSCOPIC
Ketones, ur: 15 mg/dL — AB
Nitrite: NEGATIVE
Protein, ur: NEGATIVE mg/dL
Urobilinogen, UA: 0.2 mg/dL (ref 0.0–1.0)
pH: 5.5 (ref 5.0–8.0)

## 2011-08-20 LAB — URINE MICROSCOPIC-ADD ON

## 2011-08-20 MED ORDER — IOHEXOL 300 MG/ML  SOLN: 80.0000 mL | Freq: Once | INTRAMUSCULAR | Status: DC | PRN

## 2011-08-20 MED ORDER — OXYCODONE-ACETAMINOPHEN 5-325 MG PO TABS: 1.0000 | ORAL_TABLET | ORAL | Status: DC | PRN

## 2011-08-20 MED ORDER — SULFAMETHOXAZOLE-TMP DS 800-160 MG PO TABS: 1.0000 | ORAL_TABLET | Freq: Two times a day (BID) | ORAL | Status: DC

## 2011-08-20 MED ORDER — OXYCODONE-ACETAMINOPHEN 5-325 MG PO TABS
1.0000 | ORAL_TABLET | ORAL | Status: DC | PRN
  Administered 2011-08-20: 1 via ORAL
  Filled 2011-08-20: qty 1

## 2011-08-20 MED ORDER — METOCLOPRAMIDE HCL 5 MG/ML IJ SOLN
10.0000 mg | Freq: Once | INTRAMUSCULAR | Status: AC
  Administered 2011-08-20: 10 mg via INTRAVENOUS
  Filled 2011-08-20: qty 2

## 2011-08-20 MED ORDER — PROMETHAZINE HCL 25 MG PO TABS: 25.0000 mg | ORAL_TABLET | Freq: Four times a day (QID) | ORAL | Status: AC | PRN

## 2011-08-20 MED ORDER — ONDANSETRON HCL 4 MG/2ML IJ SOLN
4.0000 mg | Freq: Once | INTRAMUSCULAR | Status: AC
  Administered 2011-08-20: 4 mg via INTRAVENOUS
  Filled 2011-08-20: qty 2

## 2011-08-20 MED ORDER — HYDROMORPHONE HCL PF 1 MG/ML IJ SOLN
0.5000 mg | Freq: Once | INTRAMUSCULAR | Status: AC
  Administered 2011-08-20: 0.5 mg via INTRAVENOUS
  Filled 2011-08-20 (×3): qty 1

## 2011-08-20 NOTE — ED Notes (Signed)
Patient transported to CT 

## 2011-08-20 NOTE — ED Notes (Signed)
The pt does not want the pain med that was ordered.  Only  The zofran was given iv.  np notified

## 2011-08-20 NOTE — ED Provider Notes (Signed)
History     CSN: [id number]  Arrival date & time 08/19/11  0865   First MD Initiated Contact with Patient 08/19/11 2135      Chief Complaint  Patient presents with  . Abdominal Pain    (Consider location/radiation/quality/duration/timing/severity/associated sxs/prior treatment) HPI Comments: Patient here with gradual worsening of upper abdominal pain with radiation through to her back - she states that the pain started in abdomen, is associated with nausea, she reports that it hurts worse to lie still, states that the pain seems to ease with standing, reports that over the past several hours that the pain has become unbearable - she denies fever, chills, vomiting, reports diarrhea as well, denies any lower abdominal pain, flank pain - states the pain is in her mid to upper back area, reports has never had a pain like this before.  Patient is a 66 y.o. female presenting with abdominal pain. The history is provided by the patient. No language interpreter was used.  Abdominal Pain The primary symptoms of the illness include abdominal pain, nausea and diarrhea. The primary symptoms of the illness do not include fever, fatigue, shortness of breath, vomiting, hematemesis, hematochezia, dysuria, vaginal discharge or vaginal bleeding. The current episode started yesterday. The onset of the illness was gradual. The problem has been gradually worsening.  The patient states that she believes she is currently not pregnant. The patient has not had a change in bowel habit. Additional symptoms associated with the illness include anorexia and back pain. Symptoms associated with the illness do not include chills, diaphoresis, heartburn, constipation, urgency, hematuria or frequency.    Past Medical History  Diagnosis Date  . Breast CA     Past Surgical History  Procedure Date  . Breast lumpectomy     No family history on file.  History  Substance Use Topics  . Smoking status: Current Some Day  Smoker  . Smokeless tobacco: Not on file  . Alcohol Use: Yes     beer sometimes    OB History    Grav Para Term Preterm Abortions TAB SAB Ect Mult Living                  Review of Systems  Constitutional: Negative for fever, chills, diaphoresis and fatigue.  Respiratory: Negative for shortness of breath.   Gastrointestinal: Positive for nausea, abdominal pain, diarrhea and anorexia. Negative for heartburn, vomiting, constipation, hematochezia and hematemesis.  Genitourinary: Negative for dysuria, urgency, frequency, hematuria, vaginal bleeding and vaginal discharge.  Musculoskeletal: Positive for back pain.  All other systems reviewed and are negative.    Allergies  Review of patient's allergies indicates no known allergies.  Home Medications   Current Outpatient Rx  Name Route Sig Dispense Refill  . ASPIRIN 325 MG PO TABS Oral Take 325 mg by mouth daily.    Marland Kitchen DIPHENHYDRAMINE HCL 25 MG PO TABS Oral Take 25 mg by mouth every 6 (six) hours as needed. For allergies    . LORATADINE 10 MG PO TABS Oral Take 10 mg by mouth daily.    Marland Kitchen TAMOXIFEN CITRATE 20 MG PO TABS Oral Take 20 mg by mouth every morning.    Marland Kitchen TRAMADOL HCL 50 MG PO TABS Oral Take 50 mg by mouth at bedtime as needed. For pain      BP 132/82  Pulse 69  Temp(Src) 98.1 F (36.7 C) (Oral)  Resp 16  SpO2 98%  Physical Exam  Nursing note and vitals reviewed. Constitutional: She is oriented  to person, place, and time. She appears well-developed and well-nourished. She appears distressed.  HENT:  Head: Normocephalic and atraumatic.  Right Ear: External ear normal.  Left Ear: External ear normal.  Nose: Nose normal.  Mouth/Throat: Oropharynx is clear and moist. No oropharyngeal exudate.  Eyes: Conjunctivae are normal. Pupils are equal, round, and reactive to light. No scleral icterus.  Neck: Normal range of motion. Neck supple.  Cardiovascular: Normal rate, regular rhythm and normal heart sounds.  Exam reveals  no gallop and no friction rub.   No murmur heard. Pulmonary/Chest: Effort normal and breath sounds normal. No respiratory distress. She has no wheezes. She has no rales. She exhibits no tenderness.  Abdominal: Soft. Bowel sounds are normal. She exhibits no distension and no mass. There is tenderness in the right upper quadrant, epigastric area and left upper quadrant. There is no rebound, no guarding, no tenderness at McBurney's point and negative Murphy's sign.    Musculoskeletal:       Thoracic back: She exhibits tenderness. She exhibits normal range of motion, no swelling and no edema.       Back:  Lymphadenopathy:    She has no cervical adenopathy.  Neurological: She is alert and oriented to person, place, and time. She has normal reflexes. No cranial nerve deficit. She exhibits normal muscle tone. Coordination normal.  Skin: Skin is warm and dry. No rash noted. No erythema. No pallor.  Psychiatric: She has a normal mood and affect. Her behavior is normal. Judgment and thought content normal.    ED Course  Procedures (including critical care time)  Labs Reviewed  COMPREHENSIVE METABOLIC PANEL - Abnormal; Notable for the following:    Glucose, Bld 106 (*)    All other components within normal limits  URINALYSIS, ROUTINE W REFLEX MICROSCOPIC - Abnormal; Notable for the following:    Ketones, ur 15 (*)    Leukocytes, UA TRACE (*)    All other components within normal limits  URINE MICROSCOPIC-ADD ON - Abnormal; Notable for the following:    Bacteria, UA MANY (*)    All other components within normal limits  CBC  DIFFERENTIAL  LIPASE, BLOOD  POCT I-STAT TROPONIN I   Dg Chest 2 View  08/19/2011  *RADIOLOGY REPORT*  Clinical Data: Nausea, abdominal pain  CHEST - 2 VIEW  Comparison: 07/13/2008  Findings: Coarse perihilar and bibasilar interstitial markings without confluent airspace infiltrate or overt edema.  No effusion. Heart size normal.  Mid and lower thoracic spondylitic  changes.  IMPRESSION:  1.  No acute disease.  Original Report Authenticated By: Osa Craver, M.D.   Results for orders placed during the hospital encounter of 08/19/11  CBC      Component Value Range   WBC 10.0  4.0 - 10.5 (K/uL)   RBC 4.63  3.87 - 5.11 (MIL/uL)   Hemoglobin 14.7  12.0 - 15.0 (g/dL)   HCT 46.9  62.9 - 52.8 (%)   MCV 93.3  78.0 - 100.0 (fL)   MCH 31.7  26.0 - 34.0 (pg)   MCHC 34.0  30.0 - 36.0 (g/dL)   RDW 41.3  24.4 - 01.0 (%)   Platelets 213  150 - 400 (K/uL)  DIFFERENTIAL      Component Value Range   Neutrophils Relative 59  43 - 77 (%)   Neutro Abs 5.8  1.7 - 7.7 (K/uL)   Lymphocytes Relative 34  12 - 46 (%)   Lymphs Abs 3.4  0.7 - 4.0 (K/uL)  Monocytes Relative 6  3 - 12 (%)   Monocytes Absolute 0.6  0.1 - 1.0 (K/uL)   Eosinophils Relative 2  0 - 5 (%)   Eosinophils Absolute 0.2  0.0 - 0.7 (K/uL)   Basophils Relative 0  0 - 1 (%)   Basophils Absolute 0.0  0.0 - 0.1 (K/uL)  COMPREHENSIVE METABOLIC PANEL      Component Value Range   Sodium 139  135 - 145 (mEq/L)   Potassium 3.7  3.5 - 5.1 (mEq/L)   Chloride 104  96 - 112 (mEq/L)   CO2 24  19 - 32 (mEq/L)   Glucose, Bld 106 (*) 70 - 99 (mg/dL)   BUN 13  6 - 23 (mg/dL)   Creatinine, Ser 9.81  0.50 - 1.10 (mg/dL)   Calcium 19.1  8.4 - 10.5 (mg/dL)   Total Protein 7.3  6.0 - 8.3 (g/dL)   Albumin 4.0  3.5 - 5.2 (g/dL)   AST 21  0 - 37 (U/L)   ALT 19  0 - 35 (U/L)   Alkaline Phosphatase 64  39 - 117 (U/L)   Total Bilirubin 0.3  0.3 - 1.2 (mg/dL)   GFR calc non Af Amer >90  >90 (mL/min)   GFR calc Af Amer >90  >90 (mL/min)  LIPASE, BLOOD      Component Value Range   Lipase 38  11 - 59 (U/L)  URINALYSIS, ROUTINE W REFLEX MICROSCOPIC      Component Value Range   Color, Urine YELLOW  YELLOW    APPearance CLEAR  CLEAR    Specific Gravity, Urine 1.019  1.005 - 1.030    pH 5.5  5.0 - 8.0    Glucose, UA NEGATIVE  NEGATIVE (mg/dL)   Hgb urine dipstick NEGATIVE  NEGATIVE    Bilirubin Urine NEGATIVE   NEGATIVE    Ketones, ur 15 (*) NEGATIVE (mg/dL)   Protein, ur NEGATIVE  NEGATIVE (mg/dL)   Urobilinogen, UA 0.2  0.0 - 1.0 (mg/dL)   Nitrite NEGATIVE  NEGATIVE    Leukocytes, UA TRACE (*) NEGATIVE   POCT I-STAT TROPONIN I      Component Value Range   Troponin i, poc 0.00  0.00 - 0.08 (ng/mL)   Comment 3           URINE MICROSCOPIC-ADD ON      Component Value Range   Squamous Epithelial / LPF RARE  RARE    WBC, UA 0-2  <3 (WBC/hpf)   RBC / HPF 0-2  <3 (RBC/hpf)   Bacteria, UA MANY (*) RARE    Urine-Other MUCOUS PRESENT     Dg Chest 2 View  08/19/2011  *RADIOLOGY REPORT*  Clinical Data: Nausea, abdominal pain  CHEST - 2 VIEW  Comparison: 07/13/2008  Findings: Coarse perihilar and bibasilar interstitial markings without confluent airspace infiltrate or overt edema.  No effusion. Heart size normal.  Mid and lower thoracic spondylitic changes.  IMPRESSION:  1.  No acute disease.  Original Report Authenticated By: Osa Craver, M.D.   Ct Abdomen Pelvis W Contrast  08/20/2011  *RADIOLOGY REPORT*  Clinical Data: Severe abdominal pain.  CT ABDOMEN AND PELVIS WITH CONTRAST  Technique:  Multidetector CT imaging of the abdomen and pelvis was performed following the standard protocol during bolus administration of intravenous contrast.  Contrast:  100 ml Omnipaque-300  Comparison: None.  Findings: Bibasilar ground-glass opacities.  Heart size within normal limits.  No focal liver abnormality.  Unremarkable spleen, biliary system, adrenal glands.  Dilatation of the main pancreatic duct up to 5 mm. No obstructing lesion is identified.  Symmetric renal enhancement.  No hydronephrosis or hydroureter.  No bowel obstruction.  No CT evidence for colitis.  No free intraperitoneal air or fluid.  No lymphadenopathy.  There is scattered atherosclerotic calcification of the aorta and its branches. No aneurysmal dilatation.  1 cm soft tissue attenuation along the posterior bladder, adjacent to the left UVJ.  CT  appearance to the uterus and adnexa within normal limits.  Multilevel degenerative changes of the imaged spine. No acute or aggressive appearing osseous lesion.  IMPRESSION: Mild bibasilar opacities; atelectasis versus infiltrate.  No acute abdomen pelvis CT abnormality identified.  Dilatation of the main pancreatic duct is nonspecific.  Consider a pancreas protocol MRI to exclude an ampullary lesion.  1 cm soft tissue attenuation within the bladder adjacent to the left UVJ.  It is conceivable that this represents a pseudo lesion. However, if the patient has a smoking history or has hematuria, further evaluation with a hematuria protocol CT or cystoscopy would be recommended.  Original Report Authenticated By: Waneta Martins, M.D.      Abdominal pain UTI    MDM  Patient reports pain decreased, CT reveals dilatation of the main pancreatic duct and recommends MRI for this - I have discussed this with the patient and her daughter and they will follow up with Dr. Paulino Rily for outpatient MRI for further evluation of this.  I am not sure of the significance of this but based on the patient's pain this could be the cause of this.  Will discharge the patient on pain medication and nausea medication as well as abx for the UTI>        Labrian Torregrossa C. [place], [place] 08/20/11 [phone]

## 2011-08-20 NOTE — ED Provider Notes (Signed)
Medical screening examination/treatment/procedure(s) were performed by non-physician practitioner and as supervising physician I was immediately available for consultation/collaboration.  Juliet Rude. Rubin Payor, MD 08/20/11 [phone]

## 2011-08-20 NOTE — ED Notes (Signed)
Informed patient that we really need a urine specimen. If unable to provide, we will need to in and out cath for a sample. Pt would like to try to use the bathroom again. Currently in restroom.

## 2011-08-20 NOTE — ED Notes (Signed)
Patient still drinking water for CT scan.  

## 2011-08-20 NOTE — ED Notes (Signed)
Lying with her eyes closed

## 2011-08-20 NOTE — ED Notes (Signed)
The pt has  Been asleep.  She is now awake requesting the pain med that was ordered then refused approx one hour ago

## 2011-08-22 ENCOUNTER — Other Ambulatory Visit: Payer: Self-pay | Admitting: Family Medicine

## 2011-08-22 DIAGNOSIS — K8689 Other specified diseases of pancreas: Secondary | ICD-10-CM

## 2011-08-27 ENCOUNTER — Ambulatory Visit
Admission: RE | Admit: 2011-08-27 | Discharge: 2011-08-27 | Disposition: A | Discharge status: Home / Self Care | Payer: Medicare Other | Source: Ambulatory Visit | Attending: Family Medicine | Admitting: Family Medicine

## 2011-08-27 DIAGNOSIS — K8689 Other specified diseases of pancreas: Secondary | ICD-10-CM

## 2011-08-27 MED ORDER — GADOBENATE DIMEGLUMINE 529 MG/ML IV SOLN
14.0000 mL | Freq: Once | INTRAVENOUS | Status: AC | PRN
  Administered 2011-08-27: 14 mL via INTRAVENOUS

## 2011-08-29 ENCOUNTER — Telehealth: Payer: Self-pay | Admitting: *Deleted

## 2011-08-29 ENCOUNTER — Other Ambulatory Visit (HOSPITAL_BASED_OUTPATIENT_CLINIC_OR_DEPARTMENT_OTHER): Payer: Medicare Other | Admitting: Lab

## 2011-08-29 ENCOUNTER — Ambulatory Visit (HOSPITAL_BASED_OUTPATIENT_CLINIC_OR_DEPARTMENT_OTHER): Payer: Medicare Other | Admitting: Oncology

## 2011-08-29 VITALS — BP 150/84 | HR 69 | Temp 98.2°F | Ht 61.0 in | Wt 158.4 lb

## 2011-08-29 DIAGNOSIS — Z853 Personal history of malignant neoplasm of breast: Secondary | ICD-10-CM | POA: Insufficient documentation

## 2011-08-29 DIAGNOSIS — C50519 Malignant neoplasm of lower-outer quadrant of unspecified female breast: Secondary | ICD-10-CM

## 2011-08-29 DIAGNOSIS — Z170 Estrogen receptor positive status [ER+]: Secondary | ICD-10-CM

## 2011-08-29 DIAGNOSIS — C50919 Malignant neoplasm of unspecified site of unspecified female breast: Secondary | ICD-10-CM | POA: Insufficient documentation

## 2011-08-29 LAB — CBC WITH DIFFERENTIAL/PLATELET
Basophils Absolute: 0.1 10*3/uL (ref 0.0–0.1)
HCT: 40.9 % (ref 34.8–46.6)
HGB: 14.1 g/dL (ref 11.6–15.9)
MONO#: 0.4 10*3/uL (ref 0.1–0.9)
NEUT#: 5.2 10*3/uL (ref 1.5–6.5)
NEUT%: 65 % (ref 38.4–76.8)
WBC: 8 10*3/uL (ref 3.9–10.3)
lymph#: 2.2 10*3/uL (ref 0.9–3.3)

## 2011-08-29 MED ORDER — TAMOXIFEN CITRATE 20 MG PO TABS: 20.0000 mg | ORAL_TABLET | ORAL | Status: DC

## 2011-08-29 MED ORDER — TRAMADOL HCL 50 MG PO TABS: 50.0000 mg | ORAL_TABLET | Freq: Every evening | ORAL | Status: DC | PRN

## 2011-08-29 NOTE — Telephone Encounter (Signed)
gave patient appointment for 10-03-2011 at 9:40am with dr.cornett

## 2011-08-29 NOTE — Progress Notes (Signed)
ID: Whitney Boyd   DOB: [birth year]  MR#: [id number]  WUJ#:[id number]  HISTORY OF PRESENT ILLNESS:  The patient herself palpated a mass in her right breast in December.  She brought it to Dr. Paulino Rily attention and was [facility] where bilateral diagnostic mammography and ultrasonography was performed January 10.  The mammogram and ultrasound suggested a 4.3-cm mass in the right lower outer quadrant with possibly some satellites.  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   With this information, the patient was referred to Dr. Luisa Hart.  He obtained bilateral breast MRI on January 18, which showed a solitary lesion in the lower outer quadrant of the right breast measuring 3.3 cm.  There was no abnormal enhancement in the left breast, no additional enhancing masses in the right breast and no adenopathy seen.  With this information, the patient proceeded to right lumpectomy with sentinel lymph node biopsy on August 13, 2006.  The final pathology ([id number]) showed a 2.6-cm, grade 2, infiltrating ductal carcinoma with ample margins, and no evidence of lymphovascular invasion; 0/2 sentinel lymph nodes were involved.  She completed adjuvant chemotherapy and radiation as noted below before starting tamoxifen.  INTERVAL HISTORY: The patient returns today for followup of her breast cancer. She is tolerating the tamoxifen well and will be discontinuing that as of July of this year. She is very comfortable with this being her last visit here  PAST MEDICAL HISTORY: Past Medical History  Diagnosis Date  . Breast CA   The past medical history is significant for degenerative disk disease, continuing tobacco abuse, history of pilonidal cyst removal in 1976  and history of tonsillectomy and adenoidectomy.  PAST SURGICAL HISTORY: Past Surgical History  Procedure Date  . Breast lumpectomy     FAMILY HISTORY  The patient's father died at the age of 33 with "cancer in his back."  The patient's mother had breast cancer diagnosed at age 24, but she did well with that and finally died at age 16 from a massive heart attack.  The patient had a brother who died at 88 months and has 3 sisters.  No other first-degree relative with breast cancer, but 1 niece did have breast cancer diagnosed in her 55s; she is now doing well in her 28s.  There is no history of ovarian cancer in the family.  GYNECOLOGIC HISTORY: She is GX P2.  She went through change of life around age 16.  She did not receive hormone replacement, as she did not have significant problems with hot flashes.  SOCIAL HISTORY: She is now divorced and lives by herself.  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  Her daughter Whitney Boyd lives in [place] and is a Marketing executive.  The patient's son Whitney Boyd lives in [place], [place], and is a Curator.  The patient has 4 grandchildren.  She is a Air traffic controller.    ADVANCED DIRECTIVES: Not in place  HEALTH MAINTENANCE: History  Substance Use Topics  . Smoking status: Current Some Day Smoker  . Smokeless tobacco: Not on file  . Alcohol Use: Yes     beer sometimes  Colonoscopy:  PAP:  Bone density:  Lipid panel:  @allergies @  Current Outpatient Prescriptions  Medication Sig Dispense Refill  . aspirin 325 MG tablet Take 325 mg by mouth daily.      . diphenhydrAMINE (BENADRYL) 25 MG tablet Take 25 mg by mouth every 6 (six) hours as needed. For allergies      . tamoxifen (NOLVADEX) 20 MG tablet Take 20 mg by mouth every morning.      . traMADol (ULTRAM) 50 MG tablet Take 50 mg by mouth at bedtime as needed. For pain        REVIEW OF  SYSTEMS: She had an episode of right upper quadrant pain which took her to the emergency room. She had nausea but no vomiting. There was no fever. The evaluation as noted below initially suggested a possible pancreatic mass, but this was ruled out. 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 Otherwise she has been tolerating the tamoxifen well, with minimal hot flashes. She has some chronic low back pain and she takes tramadol at bedtime perhaps every other night or every third night to help her sleep. A detailed review of systems was otherwise entirely stable  OBJECTIVE: Filed Vitals:   08/29/11 0948  BP: 150/84  Pulse: 69  Temp: 98.2 F (36.8 C)     Body mass index is 29.93 kg/(m^2).    ECOG FS: 0  Sclerae unicteric Oropharynx clear No peripheral adenopathy Lungs no rales or rhonchi Heart regular rate and rhythm Abd benign MSK no focal spinal tenderness, no peripheral edema Neuro: nonfoca Breasts: Right breast is status post lumpectomy and radiation. There is significant scar tissue, and distorting the lateral aspect of the breast and the nipple. This is very stable however. There is no evidence of local recurrence. Left breast is unremarkable  LAB RESULTS: Lab Results  Component Value Date   WBC 8.0 08/29/2011   NEUTROABS 5.2 08/29/2011   HGB 14.1 08/29/2011   HCT 40.9 08/29/2011   MCV 93.4 08/29/2011   PLT 198 08/29/2011      Chemistry      Component Value Date/Time   NA 139 08/19/2011 2150   K 3.7 08/19/2011 2150   CL 104 08/19/2011 2150   CO2 24 08/19/2011 2150   BUN 13 08/19/2011 2150   CREATININE 0.64 08/19/2011 2150      Component Value Date/Time   CALCIUM 10.1 08/19/2011 2150   ALKPHOS 64 08/19/2011 2150   AST 21 08/19/2011 2150   ALT 19 08/19/2011 2150   BILITOT 0.3 08/19/2011 2150       No results found for this basename: INR:1;PROTIME:1 in the last 168 hours  No results found for this basename:  UACOL:1,UAPR:1,USPG:1,UPH:1,UTP:1,UGL:1,UKET:1,UBIL:1,UHGB:1,UNIT:1,UROB:1,ULEU:1,UEPI:1,UWBC:1,URBC:1,UBAC:1,CAST:1,CRYS:1,UCOM:1,BILUA:1 in the last 72 hours   STUDIES: Dg Chest 2 View  08/19/2011  *RADIOLOGY REPORT*  Clinical Data: Nausea, abdominal pain  CHEST - 2 VIEW  Comparison: 07/13/2008  Findings: Coarse perihilar and bibasilar interstitial markings without confluent airspace infiltrate or overt edema.  No effusion. Heart size normal.  Mid and lower thoracic spondylitic changes.  IMPRESSION:  1.  No acute disease.  Original Report Authenticated By: Osa Craver, M.D.   Mr Abdomen W Wo Contrast  08/27/2011  *RADIOLOGY REPORT*  Clinical Data: Abnormal pancreas on recent CT.  Upper abdominal and back pain earlier this month.  Has resolved.  History breast cancer.  MRI ABDOMEN WITH AND WITHOUT CONTRAST  Technique:  Multiplanar multisequence MR imaging of  the abdomen was performed both before and after administration of intravenous contrast.  Contrast: 14mL MULTIHANCE GADOBENATE DIMEGLUMINE 529 MG/ML IV SOLN  Comparison: CT of 08/20/2011.  Findings: A cystic structure in the inferior right breast is similar to 08/06/2007 and may be postoperative.  This measures 2.5 cm on image 4 of series 5.  Normal heart size without pericardial or pleural effusion.  Tiny cyst in the right lobe of the liver.  Normal spleen, stomach.  Multiple hyperintense foci within the gallbladder fundus on image 23 of series 5 suggest focal adenomyomatosis.  No gallstones or acute cholecystitis.  No biliary ductal dilatation.  The pancreatic duct measures 4 mm on image 21 of series 4.  Upper normal to minimally dilated.  This is followed to the level of the ampulla.  No evidence of obstructive stone or mass.  The appearance is similar to 08/06/2007.  No acute pancreatitis.  Normal adrenal glands.  Tiny interpolar right renal cyst.  Normal left kidney.  Right extrarenal pelvis. No adenopathy or ascites.  Convex left lumbar  spine curvature.  IMPRESSION:  1.  The pancreatic duct is upper normal to minimally dilated, without cause identified.  This is similar back to 08/06/2007, favored to be within normal variation.  No evidence of common duct dilatation or other findings to suggest ampullary stenosis or lesion. 2.  Gallbladder focal adenomyomatosis.  Original Report Authenticated By: Consuello Bossier, M.D.   Ct Abdomen Pelvis W Contrast  08/20/2011  *RADIOLOGY REPORT*  Clinical Data: Severe abdominal pain.  CT ABDOMEN AND PELVIS WITH CONTRAST  Technique:  Multidetector CT imaging of the abdomen and pelvis was performed following the standard protocol during bolus administration of intravenous contrast.  Contrast:  100 ml Omnipaque-300  Comparison: None.  Findings: Bibasilar ground-glass opacities.  Heart size within normal limits.  No focal liver abnormality.  Unremarkable spleen, biliary system, adrenal glands.  Dilatation of the main pancreatic duct up to 5 mm. No obstructing lesion is identified.  Symmetric renal enhancement.  No hydronephrosis or hydroureter.  No bowel obstruction.  No CT evidence for colitis.  No free intraperitoneal air or fluid.  No lymphadenopathy.  There is scattered atherosclerotic calcification of the aorta and its branches. No aneurysmal dilatation.  1 cm soft tissue attenuation along the posterior bladder, adjacent to the left UVJ.  CT appearance to the uterus and adnexa within normal limits.  Multilevel degenerative changes of the imaged spine. No acute or aggressive appearing osseous lesion.  IMPRESSION: Mild bibasilar opacities; atelectasis versus infiltrate.  No acute abdomen pelvis CT abnormality identified.  Dilatation of the main pancreatic duct is nonspecific.  Consider a pancreas protocol MRI to exclude an ampullary lesion.  1 cm soft tissue attenuation within the bladder adjacent to the left UVJ.  It is conceivable that this represents a pseudo lesion. However, if the patient has a smoking  history or has hematuria, further evaluation with a hematuria protocol CT or cystoscopy would be recommended.  Original Report Authenticated By: Waneta Martins, M.D.    ASSESSMENT: A 66 year old [place] woman status post right lumpectomy and sentinel lymph node dissection January 2008 for a T2 N0, grade 2 invasive ductal carcinoma, ER positive 99%, PR negative, HER-2/neu positive.  Status post every 3-week docetaxel/ cyclophosphamide x4 given along with trastuzumab, the trastuzumab then continued for a total of 1 year and completed in May 2009.  Also received radiation therapy, completed in July 2008 at which time she began on tamoxifen at 20 mg daily.  PLAN: She will complete her tamoxifen in July and I wrote her a prescription today to carry her through that date. She requested a prescription for tramadol, which she takes every other night or so to help her sleep and to take care of her low back pain, which is chronic. I gave her 60 tablets with one refill.  The patient will followup with her primary care physician, Dr. Paulino Rily, regarding routine health maintenance. I have made her an appointment with Dr. Luisa Hart to discuss whether it would be advisable to perform a cholecystectomy electively at some point in the near future I am not making further appointments for [facility] here, though she understands I will be glad to see her at any point in the future if the need were to arise.   Rhoda Waldvogel C    08/29/2011

## 2011-09-16 ENCOUNTER — Encounter (INDEPENDENT_AMBULATORY_CARE_PROVIDER_SITE_OTHER): Payer: Self-pay

## 2011-09-16 ENCOUNTER — Ambulatory Visit (INDEPENDENT_AMBULATORY_CARE_PROVIDER_SITE_OTHER): Payer: Medicare Other | Admitting: Surgery

## 2011-09-16 ENCOUNTER — Ambulatory Visit (INDEPENDENT_AMBULATORY_CARE_PROVIDER_SITE_OTHER): Payer: Self-pay | Admitting: Surgery

## 2011-09-16 ENCOUNTER — Encounter (INDEPENDENT_AMBULATORY_CARE_PROVIDER_SITE_OTHER): Payer: Self-pay | Admitting: Surgery

## 2011-09-16 VITALS — BP 152/82 | HR 80 | Temp 98.0°F | Resp 16 | Ht 61.0 in | Wt 154.6 lb

## 2011-09-16 DIAGNOSIS — K8050 Calculus of bile duct without cholangitis or cholecystitis without obstruction: Secondary | ICD-10-CM

## 2011-09-16 DIAGNOSIS — K8020 Calculus of gallbladder without cholecystitis without obstruction: Secondary | ICD-10-CM

## 2011-09-16 NOTE — Progress Notes (Signed)
Patient ID: Whitney Boyd, female   DOB: 07/27/1945, 65 y.o.   MRN: [id number]  Chief Complaint  Patient presents with  . Abdominal Pain    est pt new prob- eval GB    HPI Whitney Boyd is a 65 y.o. female.   HPI Patient presents at the request of Dr. Magrinat due to abdominal pain. She had an attack of abdominal pain one month ago located in her epigastrium which radiated to her back last for a number of hours and resolved by itself after eating a fatty meal. 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 Abdominal MRI was done and showed no evidence of mass or correction of the bile duct. The main pancreatic duct was 4 mm without mass. She is put herself on low-fat diet and has had no further problem Past Medical History  Diagnosis Date  . Breast CA     right breast    Past Surgical History  Procedure Date  . Breast lumpectomy 2008    right breast    Family History  Problem Relation Age of Onset  . Heart disease Mother   . Cancer Mother     breast  . Cancer Father     spine    Social History History  Substance Use Topics  . Smoking status: Current Some Day Smoker  . Smokeless tobacco: Not on file   Comment: less than .25 of pack  . Alcohol Use: Yes     beer sometimes    Allergies  Allergen Reactions  . Morphine And Related     Current Outpatient Prescriptions  Medication Sig Dispense Refill  . aspirin 325 MG tablet Take 325 mg by mouth daily.      . diphenhydrAMINE (BENADRYL) 25 MG tablet Take 25 mg by mouth every 6 (six) hours as needed. For allergies      . tamoxifen (NOLVADEX) 20 MG tablet Take 1 tablet (20 mg total) by mouth every morning.  90 tablet  2  . traMADol (ULTRAM) 50 MG tablet Take 1 tablet (50 mg total) by mouth at bedtime as needed. For pain  60 tablet  1    Review of Systems Review of Systems  Constitutional: Negative for fever, chills and unexpected weight change.  HENT: Negative for hearing loss, congestion, sore  throat, trouble swallowing and voice change.   Eyes: Negative for visual disturbance.  Respiratory: Negative for cough and wheezing.   Cardiovascular: Negative for chest pain, palpitations and leg swelling.  Gastrointestinal: Negative for nausea, vomiting, abdominal pain, diarrhea, constipation, blood in stool, abdominal distention and anal bleeding.  Genitourinary: Negative for hematuria, vaginal bleeding and difficulty urinating.  Musculoskeletal: Negative for arthralgias.  Skin: Negative for rash and wound.  Neurological: Negative for seizures, syncope and headaches.  Hematological: Negative for adenopathy. Does not bruise/bleed easily.  Psychiatric/Behavioral: Negative for confusion.    Blood pressure 152/82, pulse 80, temperature 98 F (36.7 C), temperature source Temporal, resp. rate 16, height 5' 1" (1.549 m), weight 154 lb 9.6 oz (70.126 kg).  Physical Exam Physical Exam  Constitutional: She is oriented to person, place, and time. She appears well-developed and well-nourished.  HENT:  Head: Normocephalic and atraumatic.  Eyes: EOM are normal. Pupils are equal, round, and reactive to light.  Neck: Normal range of motion. Neck supple.  Cardiovascular: Normal rate and regular rhythm.   Pulmonary/Chest: Effort normal and breath sounds normal.  Abdominal: Soft. Bowel sounds are normal.  Musculoskeletal: Normal range of motion.    Neurological: She is alert and oriented to person, place, and time.  Skin: Skin is warm and dry.  Psychiatric: She has a normal mood and affect. Judgment and thought content normal.    Data Reviewed MRI abdomen show adenomyomatosis of gallbladder.  Assessment    Biliary colic with adenomyomatosis of gallbladder    Plan    I discussed options of observation versus cholecystectomy for today. The risks, benefits and alternative therapies were discussed. Surgical benefit of cholecystectomy in this setting is 66% at relieving symptoms. She would require  further workup if surgery was unsuccessful. She would like to proceed with surgery. Laparoscopic cholecystectomy and cholangiogram were discussed with the patient.The procedure has been discussed with the patient. Operative and non operative treatments have been discussed. Risks of surgery include bleeding, infection,  Common bile duct injury,  Injury to the stomach,liver, colon,small intestine, abdominal wall,  Diaphragm,  Major blood vessels,  And the need for an open procedure.  Other risks include worsening of medical problems, death,  DVT and pulmonary embolism, and cardiovascular events.   Medical options have also been discussed. The patient has been informed of long term expectations of surgery and non surgical options,  The patient agrees to proceed.         Javarian Jakubiak A. 09/16/2011, 11:22 AM    

## 2011-09-16 NOTE — Patient Instructions (Signed)

## 2011-09-18 ENCOUNTER — Other Ambulatory Visit: Payer: Self-pay | Admitting: Oncology

## 2011-09-18 DIAGNOSIS — Z853 Personal history of malignant neoplasm of breast: Secondary | ICD-10-CM

## 2011-09-27 ENCOUNTER — Encounter (HOSPITAL_COMMUNITY): Payer: Self-pay | Admitting: Pharmacy Technician

## 2011-10-01 ENCOUNTER — Ambulatory Visit
Admission: RE | Admit: 2011-10-01 | Discharge: 2011-10-01 | Disposition: A | Discharge status: Home / Self Care | Payer: Medicare Other | Source: Ambulatory Visit | Attending: Oncology | Admitting: Oncology

## 2011-10-01 DIAGNOSIS — Z853 Personal history of malignant neoplasm of breast: Secondary | ICD-10-CM

## 2011-10-02 ENCOUNTER — Encounter (HOSPITAL_COMMUNITY): Payer: Self-pay

## 2011-10-02 ENCOUNTER — Encounter (HOSPITAL_COMMUNITY)
Admission: RE | Admit: 2011-10-02 | Discharge: 2011-10-02 | Disposition: A | Discharge status: Home / Self Care | Payer: Medicare Other | Source: Ambulatory Visit | Attending: Surgery | Admitting: Surgery

## 2011-10-02 HISTORY — DX: Nausea with vomiting, unspecified: R11.2

## 2011-10-02 HISTORY — DX: Other specified postprocedural states: Z98.890

## 2011-10-02 LAB — COMPREHENSIVE METABOLIC PANEL
ALT: 16 U/L (ref 0–35)
AST: 22 U/L (ref 0–37)
Albumin: 4 g/dL (ref 3.5–5.2)
Alkaline Phosphatase: 60 U/L (ref 39–117)
Calcium: 9.6 mg/dL (ref 8.4–10.5)
Potassium: 4.7 mEq/L (ref 3.5–5.1)
Sodium: 139 mEq/L (ref 135–145)
Total Protein: 7.4 g/dL (ref 6.0–8.3)

## 2011-10-02 LAB — DIFFERENTIAL
Eosinophils Absolute: 0.2 10*3/uL (ref 0.0–0.7)
Eosinophils Relative: 2 % (ref 0–5)
Lymphocytes Relative: 33 % (ref 12–46)
Lymphs Abs: 2.6 10*3/uL (ref 0.7–4.0)
Monocytes Absolute: 0.4 10*3/uL (ref 0.1–1.0)
Monocytes Relative: 5 % (ref 3–12)

## 2011-10-02 LAB — CBC
Hemoglobin: 14.5 g/dL (ref 12.0–15.0)
MCH: 31 pg (ref 26.0–34.0)
MCHC: 33 g/dL (ref 30.0–36.0)
Platelets: 219 10*3/uL (ref 150–400)

## 2011-10-02 LAB — SURGICAL PCR SCREEN
MRSA, PCR: NEGATIVE
Staphylococcus aureus: NEGATIVE

## 2011-10-02 NOTE — Patient Instructions (Signed)
YOUR SURGERY IS SCHEDULED ON:   Thursday  3/28  AT 9:30 AM  REPORT TO [facility]AY [facility] AT:  7:30 AM      PHONE # FOR SHORT STAY IS [phone]  DO NOT EAT OR DRINK ANYTHING AFTER MIDNIGHT THE NIGHT BEFORE YOUR SURGERY.  YOU MAY BRUSH YOUR TEETH, RINSE OUT YOUR MOUTH--BUT NO WATER, NO FOOD, NO CHEWING GUM, NO MINTS, NO CANDIES, NO CHEWING TOBACCO.  PLEASE TAKE THE FOLLOWING MEDICATIONS THE AM OF YOUR SURGERY WITH A FEW SIPS OF WATER:  TAMOXIFEN    IF YOU USE INHALERS--USE YOUR INHALERS THE AM OF YOUR SURGERY AND BRING INHALERS TO THE HOSPITAL -TAKE TO SURGERY.    IF YOU ARE DIABETIC:  DO NOT TAKE ANY DIABETIC MEDICATIONS THE AM OF YOUR SURGERY.  IF YOU TAKE INSULIN IN THE EVENINGS--PLEASE ONLY TAKE 1/2 NORMAL EVENING DOSE THE NIGHT BEFORE YOUR SURGERY.  NO INSULIN THE AM OF YOUR SURGERY.  IF YOU HAVE SLEEP APNEA AND USE CPAP OR BIPAP--PLEASE BRING THE MASK --NOT THE MACHINE-NOT THE TUBING   -JUST THE MASK. DO NOT BRING VALUABLES, MONEY, CREDIT CARDS.  CONTACT LENS, DENTURES / PARTIALS, GLASSES SHOULD NOT BE WORN TO SURGERY AND IN MOST CASES-HEARING AIDS WILL NEED TO BE REMOVED.  BRING YOUR GLASSES CASE, ANY EQUIPMENT NEEDED FOR YOUR CONTACT LENS. FOR PATIENTS ADMITTED TO THE HOSPITAL--CHECK OUT TIME THE DAY OF DISCHARGE IS 11:00 AM.  ALL INPATIENT ROOMS ARE PRIVATE - WITH BATHROOM, TELEPHONE, TELEVISION AND WIFI INTERNET. IF YOU ARE BEING DISCHARGED THE SAME DAY OF YOUR SURGERY--YOU CAN NOT DRIVE YOURSELF HOME--AND SHOULD NOT GO HOME ALONE BY TAXI OR BUS.  NO DRIVING OR OPERATING MACHINERY FOR 24 HOURS FOLLOWING ANESTHESIA / PAIN MEDICATIONS.                            SPECIAL INSTRUCTIONS:  CHLORHEXIDINE SOAP SHOWER (other brand names are Betasept and Hibiclens ) PLEASE SHOWER WITH CHLORHEXIDINE THE NIGHT BEFORE YOUR SURGERY AND THE AM OF YOUR SURGERY. DO NOT USE CHLORHEXIDINE ON YOUR FACE OR PRIVATE AREAS--YOU MAY USE YOUR NORMAL SOAP THOSE AREAS AND YOUR NORMAL SHAMPOO.  WOMEN  SHOULD AVOID SHAVING UNDER ARMS AND SHAVING LEGS 48 HOURS BEFORE USING CHLORHEXIDINE TO AVOID SKIN IRRITATION.  DO NOT USE IF ALLERGIC TO CHLORHEXIDINE.  PLEASE READ OVER ANY  FACT SHEETS THAT YOU WERE GIVEN: MRSA INFORMATION

## 2011-10-02 NOTE — Pre-Procedure Instructions (Signed)
PT HAS CXR AND EKG REPORTS FROM 08/19/11 - DONE AT [facility]--REPORTS ARE IN EPIC AND COPIES ON PT'S CHART. CBC WITH DIFF, CMET WERE DONE TODAY PREOP

## 2011-10-03 ENCOUNTER — Ambulatory Visit (INDEPENDENT_AMBULATORY_CARE_PROVIDER_SITE_OTHER): Payer: Self-pay | Admitting: Surgery

## 2011-10-10 ENCOUNTER — Ambulatory Visit (HOSPITAL_COMMUNITY): Payer: Medicare Other | Admitting: Anesthesiology

## 2011-10-10 ENCOUNTER — Encounter (HOSPITAL_COMMUNITY): Payer: Self-pay | Admitting: Anesthesiology

## 2011-10-10 ENCOUNTER — Ambulatory Visit (HOSPITAL_COMMUNITY): Payer: Medicare Other

## 2011-10-10 ENCOUNTER — Encounter (HOSPITAL_COMMUNITY)
Admission: RE | Disposition: A | Discharge status: Home / Self Care | Payer: Self-pay | Source: Ambulatory Visit | Attending: Surgery

## 2011-10-10 ENCOUNTER — Ambulatory Visit (HOSPITAL_COMMUNITY)
Admission: RE | Admit: 2011-10-10 | Discharge: 2011-10-10 | Disposition: A | Discharge status: Home / Self Care | Payer: Medicare Other | Source: Ambulatory Visit | Attending: Surgery | Admitting: Surgery

## 2011-10-10 ENCOUNTER — Encounter (HOSPITAL_COMMUNITY): Payer: Self-pay | Admitting: *Deleted

## 2011-10-10 DIAGNOSIS — Z853 Personal history of malignant neoplasm of breast: Secondary | ICD-10-CM | POA: Insufficient documentation

## 2011-10-10 DIAGNOSIS — K8010 Calculus of gallbladder with chronic cholecystitis without obstruction: Secondary | ICD-10-CM | POA: Insufficient documentation

## 2011-10-10 DIAGNOSIS — Z01812 Encounter for preprocedural laboratory examination: Secondary | ICD-10-CM | POA: Insufficient documentation

## 2011-10-10 DIAGNOSIS — K811 Chronic cholecystitis: Secondary | ICD-10-CM

## 2011-10-10 DIAGNOSIS — C50919 Malignant neoplasm of unspecified site of unspecified female breast: Secondary | ICD-10-CM

## 2011-10-10 DIAGNOSIS — Z79899 Other long term (current) drug therapy: Secondary | ICD-10-CM | POA: Insufficient documentation

## 2011-10-10 HISTORY — PX: CHOLECYSTECTOMY: SHX55

## 2011-10-10 SURGERY — LAPAROSCOPIC CHOLECYSTECTOMY WITH INTRAOPERATIVE CHOLANGIOGRAM
Anesthesia: General | Site: Abdomen | Wound class: Clean Contaminated

## 2011-10-10 MED ORDER — MIDAZOLAM HCL 5 MG/5ML IJ SOLN
INTRAMUSCULAR | Status: DC | PRN
  Administered 2011-10-10 (×2): 1 mg via INTRAVENOUS

## 2011-10-10 MED ORDER — ACETAMINOPHEN 325 MG PO TABS: 650.0000 mg | ORAL_TABLET | ORAL | Status: DC | PRN

## 2011-10-10 MED ORDER — OXYCODONE HCL 5 MG PO TABS: 5.0000 mg | ORAL_TABLET | ORAL | Status: DC | PRN

## 2011-10-10 MED ORDER — DROPERIDOL 2.5 MG/ML IJ SOLN
INTRAMUSCULAR | Status: DC | PRN
  Administered 2011-10-10: 0.625 mg via INTRAVENOUS

## 2011-10-10 MED ORDER — FENTANYL CITRATE 0.05 MG/ML IJ SOLN
INTRAMUSCULAR | Status: AC
  Filled 2011-10-10: qty 2

## 2011-10-10 MED ORDER — FENTANYL CITRATE 0.05 MG/ML IJ SOLN
INTRAMUSCULAR | Status: DC | PRN
  Administered 2011-10-10 (×3): 50 ug via INTRAVENOUS
  Administered 2011-10-10: 100 ug via INTRAVENOUS

## 2011-10-10 MED ORDER — CEFAZOLIN SODIUM-DEXTROSE 2-3 GM-% IV SOLR
INTRAVENOUS | Status: AC
  Filled 2011-10-10: qty 50

## 2011-10-10 MED ORDER — CEFAZOLIN SODIUM-DEXTROSE 2-3 GM-% IV SOLR
2.0000 g | INTRAVENOUS | Status: AC
  Administered 2011-10-10: 2 g via INTRAVENOUS

## 2011-10-10 MED ORDER — ACETAMINOPHEN 650 MG RE SUPP
650.0000 mg | RECTAL | Status: DC | PRN
  Filled 2011-10-10: qty 1

## 2011-10-10 MED ORDER — DEXAMETHASONE SODIUM PHOSPHATE 10 MG/ML IJ SOLN
INTRAMUSCULAR | Status: DC | PRN
  Administered 2011-10-10: 10 mg via INTRAVENOUS

## 2011-10-10 MED ORDER — SCOPOLAMINE 1 MG/3DAYS TD PT72
MEDICATED_PATCH | TRANSDERMAL | Status: DC | PRN
  Administered 2011-10-10: 1 via TRANSDERMAL

## 2011-10-10 MED ORDER — HYDROMORPHONE HCL PF 1 MG/ML IJ SOLN
0.5000 mg | INTRAMUSCULAR | Status: DC | PRN
  Administered 2011-10-10 (×2): 0.5 mg via INTRAVENOUS

## 2011-10-10 MED ORDER — FENTANYL CITRATE 0.05 MG/ML IJ SOLN
25.0000 ug | INTRAMUSCULAR | Status: DC | PRN
  Administered 2011-10-10 (×2): 50 ug via INTRAVENOUS

## 2011-10-10 MED ORDER — PROMETHAZINE HCL 25 MG/ML IJ SOLN: 6.2500 mg | INTRAMUSCULAR | Status: DC | PRN

## 2011-10-10 MED ORDER — SODIUM CHLORIDE 0.9 % IV SOLN: 250.0000 mL | INTRAVENOUS | Status: DC | PRN

## 2011-10-10 MED ORDER — SODIUM CHLORIDE 0.9 % IJ SOLN: 3.0000 mL | INTRAMUSCULAR | Status: DC | PRN

## 2011-10-10 MED ORDER — FENTANYL CITRATE 0.05 MG/ML IJ SOLN: 25.0000 ug | INTRAMUSCULAR | Status: DC | PRN

## 2011-10-10 MED ORDER — ROCURONIUM BROMIDE 100 MG/10ML IV SOLN
INTRAVENOUS | Status: DC | PRN
  Administered 2011-10-10: 5 mg via INTRAVENOUS
  Administered 2011-10-10: 30 mg via INTRAVENOUS

## 2011-10-10 MED ORDER — PROPOFOL 10 MG/ML IV BOLUS
INTRAVENOUS | Status: DC | PRN
  Administered 2011-10-10: 130 mg via INTRAVENOUS

## 2011-10-10 MED ORDER — IOHEXOL 300 MG/ML  SOLN
INTRAMUSCULAR | Status: AC
  Filled 2011-10-10: qty 1

## 2011-10-10 MED ORDER — LACTATED RINGERS IV SOLN
INTRAVENOUS | Status: DC
  Administered 2011-10-10: 1000 mL via INTRAVENOUS
  Administered 2011-10-10: 10:00:00 via INTRAVENOUS

## 2011-10-10 MED ORDER — SCOPOLAMINE 1 MG/3DAYS TD PT72
MEDICATED_PATCH | TRANSDERMAL | Status: AC
  Filled 2011-10-10: qty 1

## 2011-10-10 MED ORDER — ACETAMINOPHEN 10 MG/ML IV SOLN
INTRAVENOUS | Status: DC | PRN
  Administered 2011-10-10: 1000 mg via INTRAVENOUS

## 2011-10-10 MED ORDER — ONDANSETRON HCL 4 MG/2ML IJ SOLN
INTRAMUSCULAR | Status: DC | PRN
  Administered 2011-10-10: 4 mg via INTRAVENOUS

## 2011-10-10 MED ORDER — HYDROMORPHONE HCL PF 1 MG/ML IJ SOLN
INTRAMUSCULAR | Status: AC
  Filled 2011-10-10: qty 1

## 2011-10-10 MED ORDER — IOHEXOL 300 MG/ML  SOLN
INTRAMUSCULAR | Status: DC | PRN
  Administered 2011-10-10: 11 mL via INTRAVENOUS

## 2011-10-10 MED ORDER — LIDOCAINE HCL (CARDIAC) 20 MG/ML IV SOLN
INTRAVENOUS | Status: DC | PRN
  Administered 2011-10-10: 70 mg via INTRAVENOUS

## 2011-10-10 MED ORDER — BUPIVACAINE-EPINEPHRINE 0.25% -1:200000 IJ SOLN
INTRAMUSCULAR | Status: AC
  Filled 2011-10-10: qty 1

## 2011-10-10 MED ORDER — ACETAMINOPHEN 10 MG/ML IV SOLN
INTRAVENOUS | Status: AC
  Filled 2011-10-10: qty 100

## 2011-10-10 MED ORDER — 0.9 % SODIUM CHLORIDE (POUR BTL) OPTIME
TOPICAL | Status: DC | PRN
  Administered 2011-10-10: 1000 mL

## 2011-10-10 MED ORDER — ONDANSETRON HCL 4 MG/2ML IJ SOLN: 4.0000 mg | Freq: Four times a day (QID) | INTRAMUSCULAR | Status: DC | PRN

## 2011-10-10 MED ORDER — TRAMADOL HCL 50 MG PO TABS: 50.0000 mg | ORAL_TABLET | Freq: Four times a day (QID) | ORAL | Status: AC | PRN

## 2011-10-10 MED ORDER — BUPIVACAINE-EPINEPHRINE 0.25% -1:200000 IJ SOLN
INTRAMUSCULAR | Status: DC | PRN
  Administered 2011-10-10: 15 mL

## 2011-10-10 MED ORDER — OXYCODONE-ACETAMINOPHEN 5-325 MG PO TABS: 1.0000 | ORAL_TABLET | ORAL | Status: AC | PRN

## 2011-10-10 MED ORDER — SODIUM CHLORIDE 0.9 % IJ SOLN: 3.0000 mL | Freq: Two times a day (BID) | INTRAMUSCULAR | Status: DC

## 2011-10-10 MED ORDER — LACTATED RINGERS IR SOLN
Status: DC | PRN
  Administered 2011-10-10: 1000 mL

## 2011-10-10 MED ORDER — SCOPOLAMINE 1 MG/3DAYS TD PT72
1.0000 | MEDICATED_PATCH | Freq: Once | TRANSDERMAL | Status: DC
  Filled 2011-10-10: qty 1

## 2011-10-10 SURGICAL SUPPLY — 38 items
ADH SKN CLS APL DERMABOND .7 (GAUZE/BANDAGES/DRESSINGS) ×2
APPLIER CLIP ROT 10 11.4 M/L (STAPLE) ×2
APR CLP MED LRG 11.4X10 (STAPLE) ×1
BAG SPEC RTRVL LRG 6X4 10 (ENDOMECHANICALS) ×1
CANISTER SUCTION 2500CC (MISCELLANEOUS) ×2 IMPLANT
CHLORAPREP W/TINT 26ML (MISCELLANEOUS) ×2 IMPLANT
CLIP APPLIE ROT 10 11.4 M/L (STAPLE) ×1 IMPLANT
CLOTH BEACON ORANGE TIMEOUT ST (SAFETY) ×2 IMPLANT
COVER MAYO STAND STRL (DRAPES) ×2 IMPLANT
DECANTER SPIKE VIAL GLASS SM (MISCELLANEOUS) ×2 IMPLANT
DERMABOND ADVANCED (GAUZE/BANDAGES/DRESSINGS) ×2
DERMABOND ADVANCED .7 DNX12 (GAUZE/BANDAGES/DRESSINGS) IMPLANT
DRAPE C-ARM 42X72 X-RAY (DRAPES) ×2 IMPLANT
DRAPE LAPAROSCOPIC ABDOMINAL (DRAPES) ×2 IMPLANT
DRAPE WARM FLUID 44X44 (DRAPE) ×2 IMPLANT
ELECT REM PT RETURN 9FT ADLT (ELECTROSURGICAL) ×2
ELECTRODE REM PT RTRN 9FT ADLT (ELECTROSURGICAL) ×1 IMPLANT
FILTER SMOKE EVAC LAPAROSHD (FILTER) ×2 IMPLANT
GLOVE BIOGEL PI IND STRL 7.0 (GLOVE) ×1 IMPLANT
GLOVE BIOGEL PI INDICATOR 7.0 (GLOVE) ×1
GLOVE INDICATOR 8.0 STRL GRN (GLOVE) ×4 IMPLANT
GLOVE SS BIOGEL STRL SZ 8 (GLOVE) ×1 IMPLANT
GLOVE SUPERSENSE BIOGEL SZ 8 (GLOVE) ×1
GOWN STRL NON-REIN LRG LVL3 (GOWN DISPOSABLE) ×2 IMPLANT
GOWN STRL REIN XL XLG (GOWN DISPOSABLE) ×4 IMPLANT
HEMOSTAT SURGICEL 4X8 (HEMOSTASIS) IMPLANT
KIT BASIN OR (CUSTOM PROCEDURE TRAY) ×2 IMPLANT
POUCH SPECIMEN RETRIEVAL 10MM (ENDOMECHANICALS) ×1 IMPLANT
SCISSORS LAP 5X35 DISP (ENDOMECHANICALS) IMPLANT
SET CHOLANGIOGRAPH MIX (MISCELLANEOUS) ×2 IMPLANT
SET IRRIG TUBING LAPAROSCOPIC (IRRIGATION / IRRIGATOR) ×2 IMPLANT
SUT MNCRL AB 4-0 PS2 18 (SUTURE) ×2 IMPLANT
TOWEL OR 17X26 10 PK STRL BLUE (TOWEL DISPOSABLE) ×2 IMPLANT
TRAY LAP CHOLE (CUSTOM PROCEDURE TRAY) ×2 IMPLANT
TROCAR BLADELESS OPT 5 75 (ENDOMECHANICALS) ×4 IMPLANT
TROCAR XCEL BLUNT TIP 100MML (ENDOMECHANICALS) ×2 IMPLANT
TROCAR XCEL NON-BLD 11X100MML (ENDOMECHANICALS) ×2 IMPLANT
TUBING INSUFFLATION 10FT LAP (TUBING) ×2 IMPLANT

## 2011-10-10 NOTE — Discharge Instructions (Signed)
[facility] ______[facility] °LAPAROSCOPIC SURGERY: POST OP INSTRUCTIONS °Always review your discharge instruction sheet given to you by the facility where your surgery was performed. °IF YOU HAVE DISABILITY OR FAMILY LEAVE FORMS, YOU MUST BRING THEM TO THE OFFICE FOR PROCESSING.   °DO NOT GIVE THEM TO YOUR DOCTOR. ° °1. A prescription for pain medication may be given to you upon discharge.  Take your pain medication as prescribed, if needed.  If narcotic pain medicine is not needed, then you may take acetaminophen (Tylenol) or ibuprofen (Advil) as needed. °2. Take your usually prescribed medications unless otherwise directed. °3. If you need a refill on your pain medication, please contact your pharmacy.  They will contact our office to request authorization. Prescriptions will not be filled after 5pm or on week-ends. °4. You should follow a light diet the first few days after arrival home, such as soup and crackers, etc.  Be sure to include lots of fluids daily. °5. Most patients will experience some swelling and bruising in the area of the incisions.  Ice packs will help.  Swelling and bruising can take several days to resolve.  °6. It is common to experience some constipation if taking pain medication after surgery.  Increasing fluid intake and taking a stool softener (such as Colace) will usually help or prevent this problem from occurring.  A mild laxative (Milk of Magnesia or Miralax) should be taken according to package instructions if there are no bowel movements after 48 hours. °7. Unless discharge instructions indicate otherwise, you may remove your bandages 24-48 hours after surgery, and you may shower at that time.  You may have steri-strips (small skin tapes) in place directly over the incision.  These strips should be left on the skin for 7-10 days.  If your surgeon used skin glue on the incision, you may shower in 24 hours.  The glue will flake off over the next 2-3 weeks.  Any sutures or  staples will be removed at the office during your follow-up visit. °8. ACTIVITIES:  You may resume regular (light) daily activities beginning the next day--such as daily self-care, walking, climbing stairs--gradually increasing activities as tolerated.  You may have sexual intercourse when it is comfortable.  Refrain from any heavy lifting or straining until approved by your doctor. °a. You may drive when you are no longer taking prescription pain medication, you can comfortably wear a seatbelt, and you can safely maneuver your car and apply brakes. °b. RETURN TO WORK:  __________________________________________________________ °9. You should see your doctor in the office for a follow-up appointment approximately 2-3 weeks after your surgery.  Make sure that you call for this appointment within a day or two after you arrive home to insure a convenient appointment time. °10. OTHER INSTRUCTIONS: __________________________________________________________________________________________________________________________ __________________________________________________________________________________________________________________________ °WHEN TO CALL YOUR DOCTOR: °1. Fever over 101.0 °2. Inability to urinate °3. Continued bleeding from incision. °4. Increased pain, redness, or drainage from the incision. °5. Increasing abdominal pain ° °The clinic staff is available to answer your questions during regular business hours.  Please don’t hesitate to call and ask to speak to one of the nurses for clinical concerns.  If you have a medical emergency, go to the nearest emergency room or call 911.  A surgeon from [facility] is always on call at the hospital. °[address], [address], [place], [place]  [address] ? [address] [address], , [place]   [address] °[phone] ? [phone] ? [phone] [phone] °Web site:   [email or web address] °

## 2011-10-10 NOTE — Anesthesia Postprocedure Evaluation (Signed)
  Anesthesia Post-op Note  Patient: Whitney Boyd  Procedure(s) Performed: Procedure(s) (LRB): LAPAROSCOPIC CHOLECYSTECTOMY WITH INTRAOPERATIVE CHOLANGIOGRAM (N/A)  Patient Location: PACU  Anesthesia Type: General  Level of Consciousness: awake and alert   Airway and Oxygen Therapy: Patient Spontanous Breathing  Post-op Pain: mild  Post-op Assessment: Post-op Vital signs reviewed, Patient's Cardiovascular Status Stable, Respiratory Function Stable, Patent Airway and No signs of Nausea or vomiting  Post-op Vital Signs: stable  Complications: No apparent anesthesia complications

## 2011-10-10 NOTE — Transfer of Care (Signed)
Immediate Anesthesia Transfer of Care Note  Patient: Whitney Boyd  Procedure(s) Performed: Procedure(s) (LRB): LAPAROSCOPIC CHOLECYSTECTOMY WITH INTRAOPERATIVE CHOLANGIOGRAM (N/A)  Patient Location: PACU  Anesthesia Type: General  Level of Consciousness: sedated, patient cooperative and responds to stimulaton  Airway & Oxygen Therapy: Patient Spontanous Breathing and Patient connected to face mask oxgen  Post-op Assessment: Report given to PACU RN and Post -op Vital signs reviewed and stable  Post vital signs: Reviewed and stable  Complications: No apparent anesthesia complications

## 2011-10-10 NOTE — H&P (View-Only) (Signed)
Patient ID: Whitney Boyd, female   DOB: 02-18-1946, 66 y.o.   MRN: [id number]  Chief Complaint  Patient presents with  . Abdominal Pain    est pt new prob- eval GB    HPI Whitney Boyd is a 66 y.o. female.   HPI Patient presents at the request of Dr. Darnelle Catalan due to abdominal pain. She had an attack of abdominal pain one month ago located in her epigastrium which radiated to her back last for a number of hours and resolved by itself after eating a fatty meal. 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 Abdominal MRI was done and showed no evidence of mass or correction of the bile duct. The main pancreatic duct was 4 mm without mass. She is put herself on low-fat diet and has had no further problem Past Medical History  Diagnosis Date  . Breast CA     right breast    Past Surgical History  Procedure Date  . Breast lumpectomy 2008    right breast    Family History  Problem Relation Age of Onset  . Heart disease Mother   . Cancer Mother     breast  . Cancer Father     spine    Social History History  Substance Use Topics  . Smoking status: Current Some Day Smoker  . Smokeless tobacco: Not on file   Comment: less than .25 of pack  . Alcohol Use: Yes     beer sometimes    Allergies  Allergen Reactions  . Morphine And Related     Current Outpatient Prescriptions  Medication Sig Dispense Refill  . aspirin 325 MG tablet Take 325 mg by mouth daily.      . diphenhydrAMINE (BENADRYL) 25 MG tablet Take 25 mg by mouth every 6 (six) hours as needed. For allergies      . tamoxifen (NOLVADEX) 20 MG tablet Take 1 tablet (20 mg total) by mouth every morning.  90 tablet  2  . traMADol (ULTRAM) 50 MG tablet Take 1 tablet (50 mg total) by mouth at bedtime as needed. For pain  60 tablet  1    Review of Systems Review of Systems  Constitutional: Negative for fever, chills and unexpected weight change.  HENT: Negative for hearing loss, congestion, sore  throat, trouble swallowing and voice change.   Eyes: Negative for visual disturbance.  Respiratory: Negative for cough and wheezing.   Cardiovascular: Negative for chest pain, palpitations and leg swelling.  Gastrointestinal: Negative for nausea, vomiting, abdominal pain, diarrhea, constipation, blood in stool, abdominal distention and anal bleeding.  Genitourinary: Negative for hematuria, vaginal bleeding and difficulty urinating.  Musculoskeletal: Negative for arthralgias.  Skin: Negative for rash and wound.  Neurological: Negative for seizures, syncope and headaches.  Hematological: Negative for adenopathy. Does not bruise/bleed easily.  Psychiatric/Behavioral: Negative for confusion.    Blood pressure 152/82, pulse 80, temperature 98 F (36.7 C), temperature source Temporal, resp. rate 16, height 5\' 1"  (1.549 m), weight 154 lb 9.6 oz (70.126 kg).  Physical Exam Physical Exam  Constitutional: She is oriented to person, place, and time. She appears well-developed and well-nourished.  HENT:  Head: Normocephalic and atraumatic.  Eyes: EOM are normal. Pupils are equal, round, and reactive to light.  Neck: Normal range of motion. Neck supple.  Cardiovascular: Normal rate and regular rhythm.   Pulmonary/Chest: Effort normal and breath sounds normal.  Abdominal: Soft. Bowel sounds are normal.  Musculoskeletal: Normal range of motion.  Neurological: She is alert and oriented to person, place, and time.  Skin: Skin is warm and dry.  Psychiatric: She has a normal mood and affect. Judgment and thought content normal.    Data Reviewed MRI abdomen show adenomyomatosis of gallbladder.  Assessment    Biliary colic with adenomyomatosis of gallbladder    Plan    I discussed options of observation versus cholecystectomy for today. The risks, benefits and alternative therapies were discussed. Surgical benefit of cholecystectomy in this setting is 66% at relieving symptoms. She would require  further workup if surgery was unsuccessful. She would like to proceed with surgery. Laparoscopic cholecystectomy and cholangiogram were discussed with the patient.The procedure has been discussed with the patient. Operative and non operative treatments have been discussed. Risks of surgery include bleeding, infection,  Common bile duct injury,  Injury to the stomach,liver, colon,small intestine, abdominal wall,  Diaphragm,  Major blood vessels,  And the need for an open procedure.  Other risks include worsening of medical problems, death,  DVT and pulmonary embolism, and cardiovascular events.   Medical options have also been discussed. The patient has been informed of long term expectations of surgery and non surgical options,  The patient agrees to proceed.         Sonoma Firkus A. 09/16/2011, 11:22 AM

## 2011-10-10 NOTE — Interval H&P Note (Signed)
History and Physical Interval Note:  10/10/2011 8:42 AM  Whitney Boyd  has presented today for surgery, with the diagnosis of bilinary cholic  The various methods of treatment have been discussed with the patient and family. After consideration of risks, benefits and other options for treatment, the patient has consented to  Procedure(s) (LRB): LAPAROSCOPIC CHOLECYSTECTOMY WITH INTRAOPERATIVE CHOLANGIOGRAM (N/A) as a surgical intervention .  The patients' history has been reviewed, patient examined, no change in status, stable for surgery.  I have reviewed the patients' chart and labs.  Questions were answered to the patient's satisfaction.     Zoii Florer A.

## 2011-10-10 NOTE — Anesthesia Preprocedure Evaluation (Addendum)
Anesthesia Evaluation  Patient identified by MRN, date of birth, ID band Patient awake    Reviewed: Allergy & Precautions, H&P , NPO status , Patient's Chart, lab work & pertinent test results  History of Anesthesia Complications (+) PONV  Airway Mallampati: II TM Distance: >3 FB Neck ROM: Full    Dental No notable dental hx.    Pulmonary Current Smoker,  Smokes 2-3 cigarettes per day. breath sounds clear to auscultation  Pulmonary exam normal       Cardiovascular Rhythm:Regular Rate:Normal  Echo and EKG reviewed. Mild diastolic dysfunction. Cannot rule out anterior infarction.   Neuro/Psych negative neurological ROS  negative psych ROS   GI/Hepatic negative GI ROS, Neg liver ROS,   Endo/Other  negative endocrine ROS  Renal/GU negative Renal ROS  negative genitourinary   Musculoskeletal negative musculoskeletal ROS (+)   Abdominal   Peds negative pediatric ROS (+)  Hematology negative hematology ROS (+)   Anesthesia Other Findings   Reproductive/Obstetrics negative OB ROS                          Anesthesia Physical Anesthesia Plan  ASA: II  Anesthesia Plan: General   Post-op Pain Management:    Induction: Intravenous  Airway Management Planned: Oral ETT  Additional Equipment:   Intra-op Plan:   Post-operative Plan: Extubation in OR  Informed Consent: I have reviewed the patients History and Physical, chart, labs and discussed the procedure including the risks, benefits and alternatives for the proposed anesthesia with the patient or authorized representative who has indicated his/her understanding and acceptance.   Dental advisory given  Plan Discussed with: CRNA  Anesthesia Plan Comments: (Severe PONV. Will give scop patch, zofran, decadron, droperidol. And minimize narcotics. Discussion with patient regarding PONV. She has used scop patch before without side effects.)        Anesthesia Quick Evaluation

## 2011-10-10 NOTE — Op Note (Signed)
Laparoscopic Cholecystectomy with IOC Procedure Note  Indications: This patient presents with symptomatic gallbladder disease and will undergo laparoscopic cholecystectomy.  Pre-operative Diagnosis: Calculus of gallbladder with other cholecystitis, without mention of obstruction  Post-operative Diagnosis: Same  Surgeon: Jenia Klepper A.   Assistants: OR  Anesthesia: General endotracheal anesthesia and Local anesthesia 0.25.% bupivacaine, with epinephrine  ASA Class: 3  Procedure Details  The patient was seen again in the Holding Room. The risks, benefits, complications, treatment options, and expected outcomes were discussed with the patient. The possibilities of reaction to medication, pulmonary aspiration, perforation of viscus, bleeding, recurrent infection, finding a normal gallbladder, the need for additional procedures, failure to diagnose a condition, the possible need to convert to an open procedure, and creating a complication requiring transfusion or operation were discussed with the patient. The patient and/or family concurred with the proposed plan, giving informed consent. The site of surgery properly noted/marked. The patient was taken to Operating Room, identified as Noe Gens and the procedure verified as Laparoscopic Cholecystectomy with Intraoperative Cholangiograms. A Time Out was held and the above information confirmed.  Prior to the induction of general anesthesia, antibiotic prophylaxis was administered. General endotracheal anesthesia was then administered and tolerated well. After the induction, the abdomen was prepped in the usual sterile fashion. The patient was positioned in the supine position with the left arm comfortably tucked, along with some reverse Trendelenburg.  Local anesthetic agent was injected into the skin near the umbilicus and an incision made. The midline fascia was incised and the Hasson technique was used to introduce a 10 mm port under  direct vision. It was secured with a figure of eight Vicryl suture placed in the usual fashion. Pneumoperitoneum was then created with CO2 and tolerated well without any adverse changes in the patient's vital signs. Additional trocars were introduced under direct vision. All skin incisions were infiltrated with a local anesthetic agent before making the incision and placing the trocars.   The gallbladder was identified, the fundus grasped and retracted cephalad. Adhesions were lysed bluntly and with the electrocautery where indicated, taking care not to injure any adjacent organs or viscus. The infundibulum was grasped and retracted laterally, exposing the peritoneum overlying the triangle of Calot. This was then divided and exposed in a blunt fashion. The cystic duct was clearly identified and bluntly dissected circumferentially. The junctions of the gallbladder, cystic duct and common bile duct were clearly identified prior to the division of any linear structure and photo documented.   An incision was made in the cystic duct and the cholangiogram catheter introduced. The catheter was secured using an endoclip. The study showed no stones and good visualization of the distal and proximal biliary tree. The catheter was then removed.   The cystic duct was then  ligated with surgical clips  on the patient side and  clipped on the gallbladder side and divided. The cystic artery was identified, dissected free, ligated with clips and divided as well.   Posterior branch of cystic artery was clipped and divided.  The gallbladder was dissected from the liver bed in retrograde fashion with the electrocautery. The gallbladder was removed. The liver bed was irrigated and inspected. Hemostasis was achieved with the electrocautery. Copious irrigation was utilized and was repeatedly aspirated until clear all particulate matter.  Pneumoperitoneum was completely reduced after viewing removal of the trocars under direct  vision. The wound was thoroughly irrigated and the fascia was then closed with a figure of eight suture; the skin was  then closed with 4 O monocryl and a sterile dressing was applied.  Instrument, sponge, and needle counts were correct at closure and at the conclusion of the case.   Findings: Cholecystitis without Cholelithiasis  Estimated Blood Loss: less than 50 mL         Drains: NONE         Total IV Fluids:         Specimens: Gallbladder           Complications: None; patient tolerated the procedure well.         Disposition: PACU - hemodynamically stable.         Condition: stable

## 2011-10-11 ENCOUNTER — Encounter (HOSPITAL_COMMUNITY): Payer: Self-pay | Admitting: Surgery

## 2011-10-14 ENCOUNTER — Telehealth (INDEPENDENT_AMBULATORY_CARE_PROVIDER_SITE_OTHER): Payer: Self-pay

## 2011-10-14 NOTE — Telephone Encounter (Signed)
Pt called stating she has had a rash  on her chest and abdomen. No swelling. No sob. No difficulty swallowing. Pt states she started benadryl yesterday. Pt states she has showered since she arrived home. Pt advised to stop pain med since this is the only new med added to her med list since before surgery. Pt to use benadryl lotion if this soothes area. Pt to call if rash spreads for any other symptoms occur.

## 2011-11-01 ENCOUNTER — Encounter (INDEPENDENT_AMBULATORY_CARE_PROVIDER_SITE_OTHER): Payer: Self-pay

## 2011-11-01 ENCOUNTER — Ambulatory Visit (INDEPENDENT_AMBULATORY_CARE_PROVIDER_SITE_OTHER): Payer: Medicare Other | Admitting: Surgery

## 2011-11-01 ENCOUNTER — Encounter (INDEPENDENT_AMBULATORY_CARE_PROVIDER_SITE_OTHER): Payer: Self-pay | Admitting: Surgery

## 2011-11-01 VITALS — BP 122/76 | HR 80 | Ht 61.0 in | Wt 157.4 lb

## 2011-11-01 DIAGNOSIS — Z9889 Other specified postprocedural states: Secondary | ICD-10-CM

## 2011-11-01 NOTE — Progress Notes (Signed)
Patient returns after laparoscopic cholecystectomy. She got a rash no prep otherwise has done okay.  On exam: Laparoscopic port sites clean dry and intact  Impression status post laparoscopic cholecystectomy  History of breast cancer  Plan: Return in one year for breast cancer check. If stable release from breast cancer followup.

## 2011-11-01 NOTE — Patient Instructions (Signed)
Return 1 year. 

## 2012-09-07 ENCOUNTER — Other Ambulatory Visit: Payer: Self-pay | Admitting: Family Medicine

## 2012-09-07 ENCOUNTER — Other Ambulatory Visit: Payer: Self-pay | Admitting: Oncology

## 2012-09-07 DIAGNOSIS — Z853 Personal history of malignant neoplasm of breast: Secondary | ICD-10-CM

## 2012-09-22 ENCOUNTER — Encounter (INDEPENDENT_AMBULATORY_CARE_PROVIDER_SITE_OTHER): Payer: Self-pay | Admitting: Surgery

## 2012-10-01 ENCOUNTER — Ambulatory Visit
Admission: RE | Admit: 2012-10-01 | Discharge: 2012-10-01 | Disposition: A | Discharge status: Home / Self Care | Payer: Medicare Other | Source: Ambulatory Visit | Attending: Family Medicine | Admitting: Family Medicine

## 2012-10-01 DIAGNOSIS — Z853 Personal history of malignant neoplasm of breast: Secondary | ICD-10-CM

## 2013-01-05 ENCOUNTER — Other Ambulatory Visit: Payer: Self-pay | Admitting: Family Medicine

## 2013-01-05 ENCOUNTER — Ambulatory Visit
Admission: RE | Admit: 2013-01-05 | Discharge: 2013-01-05 | Disposition: A | Discharge status: Home / Self Care | Payer: Medicare Other | Source: Ambulatory Visit | Attending: Family Medicine | Admitting: Family Medicine

## 2013-01-05 DIAGNOSIS — M549 Dorsalgia, unspecified: Secondary | ICD-10-CM

## 2013-01-05 DIAGNOSIS — M542 Cervicalgia: Secondary | ICD-10-CM

## 2013-01-11 ENCOUNTER — Other Ambulatory Visit: Payer: Medicare Other | Admitting: Lab

## 2013-01-11 ENCOUNTER — Ambulatory Visit (HOSPITAL_BASED_OUTPATIENT_CLINIC_OR_DEPARTMENT_OTHER): Payer: Medicare Other | Admitting: Genetic Counselor

## 2013-01-11 ENCOUNTER — Encounter: Payer: Self-pay | Admitting: Genetic Counselor

## 2013-01-11 DIAGNOSIS — C50919 Malignant neoplasm of unspecified site of unspecified female breast: Secondary | ICD-10-CM

## 2013-01-11 DIAGNOSIS — Z803 Family history of malignant neoplasm of breast: Secondary | ICD-10-CM

## 2013-01-11 DIAGNOSIS — IMO0002 Reserved for concepts with insufficient information to code with codable children: Secondary | ICD-10-CM

## 2013-01-11 NOTE — Progress Notes (Signed)
Dr.  Mila Palmer requested a consultation for genetic counseling and risk assessment for Whitney Boyd, a 67 y.o. female, for discussion of her personal and family history of breast cancer.  She presents to clinic today to discuss the possibility of a genetic predisposition to cancer, and to further clarify her risks, as well as her family members' risks for cancer.   HISTORY OF PRESENT ILLNESS: In 2008, at the age of 42, Whitney Boyd was diagnosed with invasive ductal carcinoma breast cancer. This was treated with surgery, chemotherapy and radiation.  The tumor was ER+. PR-. Her2+.  She took tamoxifen for 5 years and recently has finished.  She has never had a colonoscopy.    Past Medical History  Diagnosis Date  . Breast CA     right breast / s/p rt lumpectomy-pt told no needles right arm    s/p chemo and radiation  . PONV (postoperative nausea and vomiting)     pt states wearing patch behind ear has helped     Past Surgical History  Procedure Laterality Date  . Breast lumpectomy  2008    right breast  . Port a cath insertion  2008  . Removal of port a cath    . Tonsillectomy    . Cholecystectomy  10/10/2011    Procedure: LAPAROSCOPIC CHOLECYSTECTOMY WITH INTRAOPERATIVE CHOLANGIOGRAM;  Surgeon: Clovis Pu. Cornett, MD;  Location: [facility];  Service: General;  Laterality: N/A;    History   Social History  . Marital Status: Divorced    Spouse Name: N/A    Number of Children: 2  . Years of Education: N/A   Occupational History  .  [employer]   Social History Main Topics  . Smoking status: Current Some Day Smoker -- 52 years    Types: Cigarettes  . Smokeless tobacco: Never Used     Comment: less than .25 of pack /  was smoking 1/2 ppd   . Alcohol Use: Yes     Comment: beer 4-5 times/week  . Drug Use: No  . Sexually Active: None   Other Topics Concern  . None   Social History Narrative  . None    REPRODUCTIVE HISTORY AND PERSONAL RISK ASSESSMENT  FACTORS: Menarche was at age 41.   postmenopausal Uterus Intact: yes Ovaries Intact: yes G2P2A0, first live birth at age 85  She has not previously undergone treatment for infertility.   Oral Contraceptive use: 0 years   She has not used HRT in the past.    FAMILY HISTORY:  We obtained a detailed, 4-generation family history.  Significant diagnoses are listed below: Family History  Problem Relation Age of Onset  . Heart disease Mother   . Breast cancer Mother 80  . Heart attack Mother   . Cancer Father 10    Bone; thought to be related to nuclear materials he worked with  . Stroke Sister   . Breast cancer Other     dx in her mid to late 10s  The patient's mother was diagnosed with breast cancer at age 55-33 years of age.  She has three sisters who never had cancer.  One sister had a daughter who was diagnosed with breast cancer in her mid to late 79s.  Patient's maternal ancestors are of [place] descent, and paternal ancestors are of [place] descent. There is no reported Ashkenazi Jewish ancestry. There is no  known consanguinity.  GENETIC COUNSELING ASSESSMENT: Whitney Boyd is a 67 y.o. female with  a personal history of breast cancer and family history of breast cancer which somewhat suggestive of a hereditary breast and ovarian cancer syndrome and predisposition to cancer. We, therefore, discussed and recommended the following at today's visit.   DISCUSSION: We reviewed the characteristics, features and inheritance patterns of hereditary cancer syndromes. We also discussed genetic testing, including the appropriate family members to test, the process of testing, insurance coverage and turn-around-time for results. Based on Whitney Boyd's family history, we recommended her niece, who was diagnosed with breast cancer in their 23s, also have genetic counseling and testing. Whitney Boyd will speak with this family member and let Whitney Boyd know if we can be of any assistance in  coordinating genetic counseling and/or testing. In the meantime, we recommended Whitney Boyd continue to follow the cancer screening guidelines given to he by her provider. We encouraged Whitney Boyd to remain in contact with cancer genetics annually so that we can continuously update the family history and inform her of any changes in cancer genetics and testing that may be of benefit for this family. Whitney Boyd's questions were answered to her satisfaction today. Our contact information was provided should additional questions or concerns arise. Thank you for the referral and allowing Whitney Boyd to share in the care of your patient. We recommended Whitney Boyd pursue genetic testing for Breast/Ovarian Cancer Panel at [facility].   PLAN: After considering the risks, benefits, and limitations, Whitney Boyd provided informed consent to pursue genetic testing and the blood sample will be sent to [facility] for analysis of the Breast/Ovarian Cancer Panle. We discussed the implications of a positive, negative and/ or variant of uncertain significance genetic test result. Results should be available within approximately 3-4 weeks' time, at which point they will be disclosed by telephone to Whitney Boyd, as will any additional her recommendations warranted by these results. Whitney Kocher Rogowski will receive a summary of her genetic counseling visit and a copy of her results once available. This information will also be available in Epic. We encouraged Whitney Boyd to remain in contact with cancer genetics annually so that we can continuously update the family history and inform her of any changes in cancer genetics and testing that may be of benefit for her family. Whitney Boyd's questions were answered to her satisfaction today. Our contact information was provided should additional questions or concerns arise.  Per the patient's request, we will contact her by telephone to discuss these  results. A follow up genetic counseling visit will be scheduled if indicated.  The patient was seen for a total of 60 minutes, greater than 50% of which was spent face-to-face counseling.  This plan is being carried out per Dr. Feliz Beam recommendations.  This note will also be sent to the referring provider via the electronic medical record. The patient will be supplied with a summary of this genetic counseling discussion as well as educational information on the discussed hereditary cancer syndromes following the conclusion of their visit.   Patient was discussed with Dr. Drue Second.   _______________________________________________________________________ For Office Staff:  Number of people involved in session: 3 Was an Intern/ student involved with case: yes

## 2013-02-09 ENCOUNTER — Telehealth: Payer: Self-pay | Admitting: Genetic Counselor

## 2013-02-09 ENCOUNTER — Encounter: Payer: Self-pay | Admitting: Genetic Counselor

## 2013-02-09 NOTE — Telephone Encounter (Signed)
Revealed negative test results, but that she has a BRCA2 VUS.  The patient seemed confused by this, and had a hard time understanding that this change is associated with an unknown risk.  Told her that bottom line, her test was negative but that there is one thing that we are waiting to hear about and eventually we will hear if it is concerning.  Asked her to have her daughter call if she had further questions.

## 2013-02-09 NOTE — Telephone Encounter (Signed)
Left good news message on phone and asked that she call back. 

## 2013-02-10 ENCOUNTER — Other Ambulatory Visit (HOSPITAL_COMMUNITY): Payer: Self-pay | Admitting: Oncology

## 2013-02-10 NOTE — Progress Notes (Signed)
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  As we discussed, it is unknown if these variants are associated with increased risk for cancer, or if they are a normal finding in you. With time, the significance will likely be determined and we will review any new information regarding these variants at that time.

## 2013-09-14 ENCOUNTER — Other Ambulatory Visit: Payer: Self-pay | Admitting: Family Medicine

## 2013-09-14 DIAGNOSIS — Z853 Personal history of malignant neoplasm of breast: Secondary | ICD-10-CM

## 2013-10-04 ENCOUNTER — Ambulatory Visit
Admission: RE | Admit: 2013-10-04 | Discharge: 2013-10-04 | Disposition: A | Discharge status: Home / Self Care | Payer: Medicare Other | Source: Ambulatory Visit | Attending: Family Medicine | Admitting: Family Medicine

## 2013-10-04 DIAGNOSIS — Z853 Personal history of malignant neoplasm of breast: Secondary | ICD-10-CM

## 2014-09-09 ENCOUNTER — Other Ambulatory Visit: Payer: Self-pay

## 2014-09-09 ENCOUNTER — Other Ambulatory Visit: Payer: Self-pay | Admitting: Family Medicine

## 2014-09-09 DIAGNOSIS — Z1231 Encounter for screening mammogram for malignant neoplasm of breast: Secondary | ICD-10-CM

## 2014-10-07 ENCOUNTER — Ambulatory Visit
Admission: RE | Admit: 2014-10-07 | Discharge: 2014-10-07 | Disposition: A | Discharge status: Home / Self Care | Payer: Medicare Other | Source: Ambulatory Visit | Attending: Family Medicine | Admitting: Family Medicine

## 2014-10-07 DIAGNOSIS — Z1231 Encounter for screening mammogram for malignant neoplasm of breast: Secondary | ICD-10-CM

## 2014-12-14 HISTORY — PX: RETINOPATHY SURGERY: SHX765

## 2015-06-10 ENCOUNTER — Inpatient Hospital Stay (HOSPITAL_COMMUNITY)
Admission: EM | Admit: 2015-06-10 | Discharge: 2015-06-12 | DRG: 440 | Disposition: A | Discharge status: Home / Self Care | Payer: Medicare Other | Attending: Internal Medicine | Admitting: Internal Medicine

## 2015-06-10 ENCOUNTER — Emergency Department (HOSPITAL_COMMUNITY): Payer: Medicare Other

## 2015-06-10 ENCOUNTER — Encounter (HOSPITAL_COMMUNITY): Payer: Self-pay | Admitting: Emergency Medicine

## 2015-06-10 DIAGNOSIS — Z853 Personal history of malignant neoplasm of breast: Secondary | ICD-10-CM

## 2015-06-10 DIAGNOSIS — Z808 Family history of malignant neoplasm of other organs or systems: Secondary | ICD-10-CM | POA: Diagnosis not present

## 2015-06-10 DIAGNOSIS — R740 Nonspecific elevation of levels of transaminase and lactic acid dehydrogenase [LDH]: Secondary | ICD-10-CM | POA: Diagnosis present

## 2015-06-10 DIAGNOSIS — Z823 Family history of stroke: Secondary | ICD-10-CM

## 2015-06-10 DIAGNOSIS — N329 Bladder disorder, unspecified: Secondary | ICD-10-CM

## 2015-06-10 DIAGNOSIS — C50919 Malignant neoplasm of unspecified site of unspecified female breast: Secondary | ICD-10-CM

## 2015-06-10 DIAGNOSIS — N3289 Other specified disorders of bladder: Secondary | ICD-10-CM | POA: Diagnosis not present

## 2015-06-10 DIAGNOSIS — Z7982 Long term (current) use of aspirin: Secondary | ICD-10-CM | POA: Diagnosis not present

## 2015-06-10 DIAGNOSIS — Z803 Family history of malignant neoplasm of breast: Secondary | ICD-10-CM

## 2015-06-10 DIAGNOSIS — K859 Acute pancreatitis without necrosis or infection, unspecified: Secondary | ICD-10-CM | POA: Diagnosis present

## 2015-06-10 DIAGNOSIS — Z9049 Acquired absence of other specified parts of digestive tract: Secondary | ICD-10-CM | POA: Diagnosis not present

## 2015-06-10 DIAGNOSIS — R7401 Elevation of levels of liver transaminase levels: Secondary | ICD-10-CM

## 2015-06-10 DIAGNOSIS — C679 Malignant neoplasm of bladder, unspecified: Secondary | ICD-10-CM | POA: Diagnosis present

## 2015-06-10 DIAGNOSIS — F17200 Nicotine dependence, unspecified, uncomplicated: Secondary | ICD-10-CM

## 2015-06-10 DIAGNOSIS — Z8249 Family history of ischemic heart disease and other diseases of the circulatory system: Secondary | ICD-10-CM

## 2015-06-10 DIAGNOSIS — K219 Gastro-esophageal reflux disease without esophagitis: Secondary | ICD-10-CM | POA: Diagnosis present

## 2015-06-10 DIAGNOSIS — Z720 Tobacco use: Secondary | ICD-10-CM | POA: Diagnosis not present

## 2015-06-10 DIAGNOSIS — Z91048 Other nonmedicinal substance allergy status: Secondary | ICD-10-CM | POA: Diagnosis not present

## 2015-06-10 DIAGNOSIS — Z9109 Other allergy status, other than to drugs and biological substances: Secondary | ICD-10-CM | POA: Diagnosis present

## 2015-06-10 DIAGNOSIS — Z8719 Personal history of other diseases of the digestive system: Secondary | ICD-10-CM

## 2015-06-10 HISTORY — DX: Personal history of other diseases of the digestive system: Z87.19

## 2015-06-10 LAB — CBC
HEMATOCRIT: 47.4 % — AB (ref 36.0–46.0)
HEMOGLOBIN: 15.5 g/dL — AB (ref 12.0–15.0)
MCH: 31.4 pg (ref 26.0–34.0)
MCHC: 32.7 g/dL (ref 30.0–36.0)
MCV: 96.1 fL (ref 78.0–100.0)
Platelets: 218 10*3/uL (ref 150–400)
RBC: 4.93 MIL/uL (ref 3.87–5.11)
RDW: 13.8 % (ref 11.5–15.5)
WBC: 11.1 10*3/uL — ABNORMAL HIGH (ref 4.0–10.5)

## 2015-06-10 LAB — HEPATIC FUNCTION PANEL
ALBUMIN: 3.8 g/dL (ref 3.5–5.0)
ALT: 124 U/L — AB (ref 14–54)
AST: 291 U/L — AB (ref 15–41)
Alkaline Phosphatase: 79 U/L (ref 38–126)
BILIRUBIN DIRECT: 0.1 mg/dL (ref 0.1–0.5)
Indirect Bilirubin: 0.4 mg/dL (ref 0.3–0.9)
TOTAL PROTEIN: 6.8 g/dL (ref 6.5–8.1)
Total Bilirubin: 0.5 mg/dL (ref 0.3–1.2)

## 2015-06-10 LAB — I-STAT TROPONIN, ED: Troponin i, poc: 0 ng/mL (ref 0.00–0.08)

## 2015-06-10 LAB — BASIC METABOLIC PANEL
Anion gap: 9 (ref 5–15)
BUN: 13 mg/dL (ref 6–20)
CO2: 27 mmol/L (ref 22–32)
Calcium: 9.3 mg/dL (ref 8.9–10.3)
Chloride: 102 mmol/L (ref 101–111)
Creatinine, Ser: 0.8 mg/dL (ref 0.44–1.00)
GLUCOSE: 126 mg/dL — AB (ref 65–99)
POTASSIUM: 4.1 mmol/L (ref 3.5–5.1)
SODIUM: 138 mmol/L (ref 135–145)

## 2015-06-10 LAB — ACETAMINOPHEN LEVEL

## 2015-06-10 LAB — LIPASE, BLOOD: Lipase: 155 U/L — ABNORMAL HIGH (ref 11–51)

## 2015-06-10 MED ORDER — GI COCKTAIL ~~LOC~~
30.0000 mL | Freq: Once | ORAL | Status: AC
  Administered 2015-06-10: 30 mL via ORAL
  Filled 2015-06-10: qty 30

## 2015-06-10 MED ORDER — IOHEXOL 300 MG/ML  SOLN
100.0000 mL | Freq: Once | INTRAMUSCULAR | Status: AC | PRN
  Administered 2015-06-10: 100 mL via INTRAVENOUS

## 2015-06-10 NOTE — ED Notes (Signed)
Pt in CT.

## 2015-06-10 NOTE — ED Notes (Signed)
Pt c/o center chest pain that radiates to back onset after eating today. Pt reports pain like when she had her gallbladder attack, but pt had her gallbladder removed. Pt had N/V diaphoresis and shortness of breath.

## 2015-06-10 NOTE — ED Provider Notes (Signed)
Medical screening examination/treatment/procedure(s) were conducted as a shared visit with non-physician practitioner(s) and myself.  I personally evaluated the patient during the encounter.  69 year old female smoker history of cholelithiasis status post cholecystectomy here with a few days progressively worsening epigastric abdominal pain that radiates around both sides and into her back. No rash, fever. She did have nausea and vomiting today of stomach contact. She has increased burping as well. On exam she has mild tenderness to palpation epigastric area no evidence of rash, lungs are clear, heart sounds normal, normal bowel sounds and no evidence of peritonitis. Most likely has gastritis or esophagitis however secondary to her age and smoking and history of abdominal surgery we'll get CT scan to rule out other causes.    EKG Interpretation   Date/Time:  Saturday June 10 2015 16:55:23 EST Ventricular Rate:  79 PR Interval:  146 QRS Duration: 76 QT Interval:  386 QTC Calculation: 442 R Axis:   21 Text Interpretation:  Normal sinus rhythm Cannot rule out Anterior infarct  , age undetermined Abnormal ECG No significant change since last tracing  Confirmed by [facility] MD, Kemonie Cutillo [phone]) on 06/10/2015 7:20:30 PM        Merrily Pew, MD 06/11/15 [id number]

## 2015-06-10 NOTE — H&P (Signed)
[facility] History and Physical  Whitney Boyd [id number] DOB: 06/22/1946 DOA: 06/10/2015  Referring physician: ED physician PCP: Lilian Coma, MD   Chief Complaint: acute abdominal pain  HPI:  Whitney Boyd is a 69yo woman with PMH of breast cancer, retinal hole s/p surgery, allergies, who presents for acute abdominal pain.  Whitney Boyd reports that after eating a "greasy" meal today of fried fish, french fries she developed severe abdominal pain in the epigastrium that radiated to the sides and back.  She noted that it felt very similar to abdominal pain she had had before with gallstones, however, she has had her gallbladder out.  They pain last about 1.5 hours and was associated with nausea, diaphoresis and being pale.  She attempted to make herself throw up, but was unable to.  She also tried to drink tea but this did not help.  She presented to the ED and a GI cocktail improved the pain. She had one recurrence while here that lasted about 10 minutes.  Whitney Boyd reports that she does not drink excessively, but she does usually drink about a beer a day. She does not know if she has high cholesterol.  Further symptoms include belching.    She had blood work that showed an elevated lipase, elevated AST/ALT.  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    Assessment and Plan: Acute pancreatitis with Transaminitis - Possible causes include retained stone, mass at the cystic duct/head of pancreas, but this is not apparent on CT scan of the abdomen - Repeat CMET in the AM - IV hydration with NS overnight, 125cc/hr - If no change in LFTs, consider GI consult for possible procedure - Pain is improved, she is allergic to morphine and does not want to try dilaudid - Tramadol for pain, aspirin for pain per patient home medications - GI cocktail if symptoms return - Zofran for nausea - Check cholesterol panel for TG    Bladder mass - Needs  outpatient follow up with urology for cystoscopy    Tobacco abuse - Has attempted to cut back - Counsel on cessation - Refused nicotine patch    Environmental allergies - PRN benadryl  Diet: She requested liquid diet, if she tolerates, can advance  DVT PPx. Lovenox  Radiological Exams on Admission: Dg Chest 2 View  06/10/2015  CLINICAL DATA:  Midline chest pain began 3 p.m. today a radiates to back, personal history of breast cancer EXAM: CHEST  2 VIEW COMPARISON:  08/19/2011 FINDINGS: The heart size and vascular pattern are normal. Stable aortic arch calcification. No pneumothorax or effusion. Status post right mastectomy. IMPRESSION: No active cardiopulmonary disease. Electronically Signed   By: Skipper Cliche M.D.   On: 06/10/2015 18:08   Ct Abdomen Pelvis W Contrast  06/10/2015  CLINICAL DATA:  Acute onset of central chest pain, radiating to the back, after eating. Nausea, vomiting and diaphoresis. Shortness of breath. Initial encounter. EXAM: CT ABDOMEN AND PELVIS WITH CONTRAST TECHNIQUE: Multidetector CT imaging of the abdomen and pelvis was performed using the standard protocol following bolus administration of intravenous contrast. CONTRAST:  147mL OMNIPAQUE IOHEXOL 300 MG/ML  SOLN COMPARISON:  CT of the abdomen and pelvis from 08/20/2011, and MRI of the abdomen performed 08/27/2011 FINDINGS: The visualized lung bases are clear. The liver and spleen are unremarkable in appearance. The patient is status post cholecystectomy, with clips noted at the gallbladder fossa. There is dilatation of the pancreatic duct  to 5 mm in diameter; this is grossly stable from 2013 and likely reflects the patient's baseline. The pancreas and adrenal glands are otherwise grossly unremarkable. The kidneys are unremarkable in appearance. There is no evidence of hydronephrosis. No renal or ureteral stones are seen. No perinephric stranding is appreciated. No free fluid is identified. The small bowel is  unremarkable in appearance. The stomach is within normal limits. No acute vascular abnormalities are seen. Scattered calcification is noted along the abdominal aorta and branches. The appendix is not definitely seen; there is no evidence of appendicitis. The colon is grossly unremarkable in appearance. The cecum is noted at the left lower quadrant. The bladder is moderately distended. There is a hazy irregular 3.3 cm mass at the left posterior aspect of the bladder, increased in size from 2013. 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 Cystoscopy and consultation for resection are recommended. The uterus is unremarkable in appearance. The ovaries are grossly symmetric. No suspicious adnexal masses are seen. No inguinal lymphadenopathy is seen. No acute osseous abnormalities are identified. Vacuum phenomenon and endplate sclerotic change are noted at L4-L5 and L5-S1. IMPRESSION: 1. Hazy irregular 3.3 cm mass at the left posterior aspect of the bladder, increased in size from 1.0 cm in 2013. 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 Cystoscopy and consultation for resection are recommended. 2. Scattered calcification along the abdominal aorta and its branches. 3. Minimal degenerative change at the lower cervical spine. 4. Scattered calcification along the abdominal aorta and its branches. 5. Stable dilatation of the pancreatic duct is unchanged from 2013 and likely reflects the patient's baseline. These results were called by telephone at the time of interpretation on 06/10/2015 at 9:39 pm to [facility] PA, who verbally acknowledged these results. Electronically Signed   By: Garald Balding M.D.   On: 06/10/2015 21:43   Code Status: Full Family Communication: Pt and daughter at bedside Disposition Plan: Admit for further evaluation    Gilles Chiquito, MD [phone]   Review of Systems:  Constitutional: + for diaphoresis Negative for  fever, chills and malaise/fatigue.  HENT: Negative for hearing loss, ear pain Eyes: Negative for blurred vision, double vision Respiratory: Negative for cough, shortness of breath, wheezing Cardiovascular: Negative for chest pain, palpitations Gastrointestinal: + for nausea, abdominal pain, heartburn Negative forvomiting and  constipation, blood in stool and melena.  Genitourinary: Negative for dysuria, urgency, frequency, hematuria Musculoskeletal: + for back pain Negative for myalgias Skin: Negative for itching and rash.  Neurological: Negative for dizziness and weakness. Endo/Heme/Allergies: Positive for environmental allergies.  Does not bruise/bleed easily.  Psychiatric/Behavioral: The patient is not nervous/anxious.      Past Medical History  Diagnosis Date  . Breast CA ([place])     right breast / s/p rt lumpectomy-pt told no needles right arm    s/p chemo and radiation  . PONV (postoperative nausea and vomiting)     pt states wearing patch behind ear has helped     Past Surgical History  Procedure Laterality Date  . Breast lumpectomy  2008    right breast  . Port a cath insertion  2008  . Removal of port a cath    . Tonsillectomy    . Cholecystectomy  10/10/2011    Procedure: LAPAROSCOPIC CHOLECYSTECTOMY WITH INTRAOPERATIVE CHOLANGIOGRAM;  Surgeon: Joyice Faster. Cornett, MD;  Location: [facility];  Service: General;  Laterality: N/A;    Social History:  reports that she has been smoking Cigarettes.  She has smoked for the past 52 years. She has never used smokeless tobacco. She reports that she drinks alcohol. She reports that she does not use illicit drugs.   She reports a pack will last her 3-4 days.  She drinks 1-2 beers a day.   Allergies  Allergen Reactions  . Morphine And Related Nausea And Vomiting    Family History  Problem Relation Age of Onset  . Heart disease Mother   . Breast cancer Mother 105  . Heart attack Mother   . Cancer Father 68    Bone; thought to be  related to nuclear materials he worked with  . Stroke Sister   . Breast cancer Other     dx in her mid to late 20s    Prior to Admission medications   Medication Sig Start Date End Date Taking? Authorizing Provider  aspirin 325 MG tablet Take 325-650 mg by mouth 2 (two) times daily as needed for moderate pain. For pain   Yes Historical Provider, MD  diphenhydrAMINE (BENADRYL) 25 MG tablet Take 25 mg by mouth every 6 (six) hours as needed. For allergies   Yes Historical Provider, MD         Multiple Vitamin (MULITIVITAMIN WITH MINERALS) TABS Take 1 tablet by mouth every morning.   Yes Historical Provider, MD  Multiple Vitamins-Minerals (HAIR/SKIN/NAILS) TABS Take 3 tablets by mouth daily.   Yes Historical Provider, MD  pseudoephedrine (SUDAFED) 120 MG 12 hr tablet Take 120 mg by mouth daily as needed for congestion.   Yes Historical Provider, MD                  Physical Exam: Filed Vitals:   06/10/15 2215 06/10/15 2230 06/10/15 2245 06/10/15 2305  BP: 125/62 118/79 118/68 120/62  Pulse: 87 90 87 86  Temp:      Resp: 19 21 21 24   Height:      Weight:      SpO2: 96% 94% 94% 95%    Physical Exam  Constitutional: Appears well-developed and well-nourished. No distress.  HENT: Normocephalic.Oropharynx is clear and moist.  Eyes: Conjunctivae and EOM are normal. PERRLA, no scleral icterus.  Neck: Normal ROM. Neck supple CVS: RRR, S1/S2 +, no murmurs  Pulmonary: Effort and breath sounds normal, no rhonchi, wheezes, rales.  Abdominal: Soft. BS +,  no distension, minimal tenderness in epigastrium Musculoskeletal: N No edema and no tenderness.  Lymphadenopathy:  Neuro: Alert. Normal muscle tone.  Skin: Skin is warm and dry. No rash noted. Not diaphoretic Psychiatric: Normal mood and affect. Behavior, judgment, thought content normal.   Labs on Admission:  Basic Metabolic Panel:  Recent Labs Lab 06/10/15 1731  NA 138  K 4.1  CL 102  CO2 27  GLUCOSE 126*  BUN 13  CREATININE  0.80  CALCIUM 9.3   Liver Function Tests:  Recent Labs Lab 06/10/15 1902  AST 291*  ALT 124*  ALKPHOS 79  BILITOT 0.5  PROT 6.8  ALBUMIN 3.8    Recent Labs Lab 06/10/15 1902  LIPASE 155*   CBC:  Recent Labs Lab 06/10/15 1731  WBC 11.1*  HGB 15.5*  HCT 47.4*  MCV 96.1  PLT 218    EKG: Normal sinus rhythm, no ST/T wave changes.  [place] baseline.    If 7PM-7AM, please contact night-coverage [email or web address] Password [facility] 06/11/2015, 12:14 AM

## 2015-06-10 NOTE — ED Notes (Signed)
Per Whitney Boyd, pt given ice chips

## 2015-06-10 NOTE — ED Notes (Signed)
Pt returned from CT °

## 2015-06-10 NOTE — ED Notes (Signed)
PT still at [facility]

## 2015-06-10 NOTE — ED Provider Notes (Signed)
CSN: [id number]     Arrival date & time 06/10/15  1643 History   First MD Initiated Contact with Patient 06/10/15 1753     Chief Complaint  Patient presents with  . Chest Pain  . Back Pain     (Consider location/radiation/quality/duration/timing/severity/associated sxs/prior Treatment) HPI  Whitney Boyd is a 69 year old female with a pmh of chronic tobacco abuse,  Breast CA ([facility]) and PONV (postoperative nausea and vomiting). He presents emergency Department with chief complaint of epigastric pain. Patient states that today she went out to eat and had fried fish, french fries and 8 Pepsi. Soon after she began having severe pain in her epigastrium. She became pale, diaphoretic and extremely nauseous. She states that the episode lasted approximately an hour and a half. She denies chest pain, shortness of breath, pain in the left jaw or shoulder. She has no previous history of cardiac disease. The patient states that she has had several episodes like this prior. She has associated belching. She has had previous cholecystectomy in 2013. The patient states that the pain radiates around the diaphragmatic region and into her back. She denies a known history of hiatal hernia. She denies any history of reflux and does not take any medications.  Past Medical History  Diagnosis Date  . Breast CA ([place])     right breast / s/p rt lumpectomy-pt told no needles right arm    s/p chemo and radiation  . PONV (postoperative nausea and vomiting)     pt states wearing patch behind ear has helped    Past Surgical History  Procedure Laterality Date  . Breast lumpectomy  2008    right breast  . Port a cath insertion  2008  . Removal of port a cath    . Tonsillectomy    . Cholecystectomy  10/10/2011    Procedure: LAPAROSCOPIC CHOLECYSTECTOMY WITH INTRAOPERATIVE CHOLANGIOGRAM;  Surgeon: Joyice Faster. Cornett, MD;  Location: [facility];  Service: General;  Laterality: N/A;   Family History  Problem Relation Age of  Onset  . Heart disease Mother   . Breast cancer Mother 14  . Heart attack Mother   . Cancer Father 37    Bone; thought to be related to nuclear materials he worked with  . Stroke Sister   . Breast cancer Other     dx in her mid to late 34s   Social History  Substance Use Topics  . Smoking status: Current Some Day Smoker -- 52 years    Types: Cigarettes  . Smokeless tobacco: Never Used     Comment: less than .25 of pack /  was smoking 1/2 ppd   . Alcohol Use: Yes     Comment: beer 4-5 times/week   OB History    No data available     Review of Systems   Ten systems reviewed and are negative for acute change, except as noted in the HPI.   Allergies  Morphine and related  Home Medications   Prior to Admission medications   Medication Sig Start Date End Date Taking? Authorizing Provider  aspirin 325 MG tablet Take 325-650 mg by mouth 2 (two) times daily as needed for moderate pain. For pain   Yes Historical Provider, MD  diphenhydrAMINE (BENADRYL) 25 MG tablet Take 25 mg by mouth every 6 (six) hours as needed. For allergies   Yes Historical Provider, MD  Lubricants (LUBRICATING JELLY) GEL Apply 1 drop topically daily as needed (for dry eyes).   Yes Historical  Provider, MD  Multiple Vitamin (MULITIVITAMIN WITH MINERALS) TABS Take 1 tablet by mouth every morning.   Yes Historical Provider, MD  Multiple Vitamins-Minerals (HAIR/SKIN/NAILS) TABS Take 3 tablets by mouth daily.   Yes Historical Provider, MD  pseudoephedrine (SUDAFED) 120 MG 12 hr tablet Take 120 mg by mouth daily as needed for congestion.   Yes Historical Provider, MD  tamoxifen (NOLVADEX) 20 MG tablet Take 1 tablet (20 mg total) by mouth every morning. 08/29/11   Chauncey Cruel, MD  traMADol (ULTRAM) 50 MG tablet Take 1 tablet (50 mg total) by mouth at bedtime as needed. For pain 08/29/11   Chauncey Cruel, MD   BP 127/67 mmHg  Pulse 76  Temp(Src) 97.6 F (36.4 C)  Resp 20  Ht 5\' 1"  (1.549 m)  Wt 68.493 kg   BMI 28.55 kg/m2  SpO2 97% Physical Exam  Constitutional: She is oriented to person, place, and time. She appears well-developed and well-nourished. No distress.  Frequent bleching during exam.  HENT:  Head: Normocephalic and atraumatic.  Eyes: Conjunctivae are normal. No scleral icterus.  Neck: Normal range of motion.  Cardiovascular: Normal rate, regular rhythm and normal heart sounds.  Exam reveals no gallop and no friction rub.   No murmur heard. Pulmonary/Chest: Effort normal and breath sounds normal. No respiratory distress.  Abdominal: Soft. Bowel sounds are normal. She exhibits no distension and no mass. There is no tenderness. There is no guarding.  Neurological: She is alert and oriented to person, place, and time.  Skin: Skin is warm and dry. She is not diaphoretic.  Nursing note and vitals reviewed.   ED Course  Procedures (including critical care time) Labs Review Labs Reviewed  BASIC METABOLIC PANEL - Abnormal; Notable for the following:    Glucose, Bld 126 (*)    All other components within normal limits  CBC - Abnormal; Notable for the following:    WBC 11.1 (*)    Hemoglobin 15.5 (*)    HCT 47.4 (*)    All other components within normal limits  HEPATIC FUNCTION PANEL - Abnormal; Notable for the following:    AST 291 (*)    ALT 124 (*)    All other components within normal limits  LIPASE, BLOOD - Abnormal; Notable for the following:    Lipase 155 (*)    All other components within normal limits  I-STAT TROPOININ, ED    Imaging Review Dg Chest 2 View  06/10/2015  CLINICAL DATA:  Midline chest pain began 3 p.m. today a radiates to back, personal history of breast cancer EXAM: CHEST  2 VIEW COMPARISON:  08/19/2011 FINDINGS: The heart size and vascular pattern are normal. Stable aortic arch calcification. No pneumothorax or effusion. Status post right mastectomy. IMPRESSION: No active cardiopulmonary disease. Electronically Signed   By: Skipper Cliche M.D.   On:  06/10/2015 18:08   I have personally reviewed and evaluated these images and lab results as part of my medical decision-making.   EKG Interpretation   Date/Time:  Saturday June 10 2015 16:55:23 EST Ventricular Rate:  79 PR Interval:  146 QRS Duration: 76 QT Interval:  386 QTC Calculation: 442 R Axis:   21 Text Interpretation:  Normal sinus rhythm Cannot rule out Anterior infarct  , age undetermined Abnormal ECG No significant change since last tracing  Confirmed by [facility] MD, Corene Cornea [phone]) on 06/10/2015 7:20:30 PM      MDM   Final diagnoses:  Acute pancreatitis, unspecified complication status, unspecified pancreatitis  type    8:50 PM BP 127/67 mmHg  Pulse 76  Temp(Src) 97.6 F (36.4 C)  Resp 20  Ht 5\' 1"  (1.549 m)  Wt 68.493 kg  BMI 28.55 kg/m2  SpO2 97% patient with elevated liver enzymes, lipase of 155. EKG and chest x-ray are not concerning for acute abnormality. I doubt acute coronary syndrome, although she does have risk factors. She has a negative troponin. I do have suspicion for possible retained stone given her elevated liver enzymes and high lipase.      Patient CT returned without Acute abnormality. I discussed the findings of the Bladder mass which is suspicious for cancer, but slow growing since prior CT in 2013. I discussed her risk factors as a smoker and True need for evaluation., Patient will be admitted by Dr. Daryll Drown for evaluation of possible retained stone. She agrees with plan of care.  Margarita Mail, PA-C 06/12/15 1652  Merrily Pew, MD 06/13/15 1256

## 2015-06-11 ENCOUNTER — Inpatient Hospital Stay (HOSPITAL_COMMUNITY): Payer: Medicare Other

## 2015-06-11 ENCOUNTER — Encounter (HOSPITAL_COMMUNITY): Payer: Self-pay | Admitting: *Deleted

## 2015-06-11 DIAGNOSIS — K859 Acute pancreatitis without necrosis or infection, unspecified: Principal | ICD-10-CM

## 2015-06-11 DIAGNOSIS — Z9109 Other allergy status, other than to drugs and biological substances: Secondary | ICD-10-CM | POA: Diagnosis present

## 2015-06-11 DIAGNOSIS — N3289 Other specified disorders of bladder: Secondary | ICD-10-CM | POA: Diagnosis present

## 2015-06-11 DIAGNOSIS — Z720 Tobacco use: Secondary | ICD-10-CM | POA: Diagnosis present

## 2015-06-11 DIAGNOSIS — Z91048 Other nonmedicinal substance allergy status: Secondary | ICD-10-CM

## 2015-06-11 DIAGNOSIS — R7401 Elevation of levels of liver transaminase levels: Secondary | ICD-10-CM | POA: Diagnosis present

## 2015-06-11 DIAGNOSIS — R740 Nonspecific elevation of levels of transaminase and lactic acid dehydrogenase [LDH]: Secondary | ICD-10-CM

## 2015-06-11 DIAGNOSIS — C679 Malignant neoplasm of bladder, unspecified: Secondary | ICD-10-CM | POA: Diagnosis present

## 2015-06-11 LAB — GLUCOSE, CAPILLARY: GLUCOSE-CAPILLARY: 89 mg/dL (ref 65–99)

## 2015-06-11 LAB — COMPREHENSIVE METABOLIC PANEL
ALBUMIN: 3.2 g/dL — AB (ref 3.5–5.0)
ALK PHOS: 75 U/L (ref 38–126)
ALT: 257 U/L — AB (ref 14–54)
AST: 245 U/L — AB (ref 15–41)
Anion gap: 7 (ref 5–15)
BILIRUBIN TOTAL: 0.6 mg/dL (ref 0.3–1.2)
BUN: 8 mg/dL (ref 6–20)
CALCIUM: 8.9 mg/dL (ref 8.9–10.3)
CO2: 23 mmol/L (ref 22–32)
CREATININE: 0.63 mg/dL (ref 0.44–1.00)
Chloride: 108 mmol/L (ref 101–111)
GFR calc Af Amer: >60 mL/min (ref >60)
GFR calc non Af Amer: >60 mL/min (ref >60)
GLUCOSE: 93 mg/dL (ref 65–99)
Potassium: 4 mmol/L (ref 3.5–5.1)
Sodium: 138 mmol/L (ref 135–145)
Total Protein: 5.7 g/dL — ABNORMAL LOW (ref 6.5–8.1)

## 2015-06-11 LAB — CBC
HCT: 40.5 % (ref 36.0–46.0)
HEMOGLOBIN: 13.2 g/dL (ref 12.0–15.0)
MCH: 31.4 pg (ref 26.0–34.0)
MCHC: 32.6 g/dL (ref 30.0–36.0)
MCV: 96.2 fL (ref 78.0–100.0)
PLATELETS: 187 10*3/uL (ref 150–400)
RBC: 4.21 MIL/uL (ref 3.87–5.11)
RDW: 13.9 % (ref 11.5–15.5)
WBC: 6.7 10*3/uL (ref 4.0–10.5)

## 2015-06-11 LAB — LIPID PANEL
CHOLESTEROL: 135 mg/dL (ref 0–200)
HDL: 42 mg/dL (ref >40)
LDL Cholesterol: 79 mg/dL (ref 0–99)
Total CHOL/HDL Ratio: 3.2 RATIO
Triglycerides: 70 mg/dL (ref <150)
VLDL: 14 mg/dL (ref 0–40)

## 2015-06-11 LAB — LIPASE, BLOOD: Lipase: 34 U/L (ref 11–51)

## 2015-06-11 MED ORDER — TRAMADOL HCL 50 MG PO TABS
50.0000 mg | ORAL_TABLET | Freq: Four times a day (QID) | ORAL | Status: DC | PRN
  Administered 2015-06-11: 50 mg via ORAL
  Filled 2015-06-11: qty 1

## 2015-06-11 MED ORDER — SODIUM CHLORIDE 0.9 % IJ SOLN
3.0000 mL | Freq: Two times a day (BID) | INTRAMUSCULAR | Status: DC
  Administered 2015-06-11 (×3): 3 mL via INTRAVENOUS

## 2015-06-11 MED ORDER — DIPHENHYDRAMINE HCL 25 MG PO TABS
25.0000 mg | ORAL_TABLET | Freq: Four times a day (QID) | ORAL | Status: DC | PRN
  Filled 2015-06-11: qty 1

## 2015-06-11 MED ORDER — ASPIRIN 325 MG PO TABS
325.0000 mg | ORAL_TABLET | Freq: Two times a day (BID) | ORAL | Status: DC | PRN
  Administered 2015-06-11: 325 mg via ORAL
  Filled 2015-06-11: qty 1

## 2015-06-11 MED ORDER — SENNOSIDES-DOCUSATE SODIUM 8.6-50 MG PO TABS: 1.0000 | ORAL_TABLET | Freq: Every evening | ORAL | Status: DC | PRN

## 2015-06-11 MED ORDER — GI COCKTAIL ~~LOC~~
30.0000 mL | Freq: Three times a day (TID) | ORAL | Status: DC | PRN
  Administered 2015-06-11 (×2): 30 mL via ORAL
  Filled 2015-06-11 (×2): qty 30

## 2015-06-11 MED ORDER — ONDANSETRON HCL 4 MG/2ML IJ SOLN: 4.0000 mg | Freq: Four times a day (QID) | INTRAMUSCULAR | Status: DC | PRN

## 2015-06-11 MED ORDER — ONDANSETRON HCL 4 MG PO TABS: 4.0000 mg | ORAL_TABLET | Freq: Four times a day (QID) | ORAL | Status: DC | PRN

## 2015-06-11 MED ORDER — ACETAMINOPHEN 325 MG PO TABS: 650.0000 mg | ORAL_TABLET | ORAL | Status: DC | PRN

## 2015-06-11 MED ORDER — ENOXAPARIN SODIUM 40 MG/0.4ML ~~LOC~~ SOLN
40.0000 mg | SUBCUTANEOUS | Status: DC
  Administered 2015-06-11: 40 mg via SUBCUTANEOUS
  Filled 2015-06-11: qty 0.4

## 2015-06-11 MED ORDER — SODIUM CHLORIDE 0.9 % IV SOLN
INTRAVENOUS | Status: AC
  Administered 2015-06-11 (×2): via INTRAVENOUS

## 2015-06-11 NOTE — Progress Notes (Signed)
Attempted report 

## 2015-06-11 NOTE — Progress Notes (Signed)
[facility]                                                                              Patient Demographics  Whitney Boyd, is a 69 y.o. female, DOB - Sep 22, 1945, [id number]  Admit date - 06/10/2015   Admitting Physician Sid Falcon, MD  Outpatient Primary MD for the patient is Lilian Coma, MD  LOS - 1   Chief Complaint  Patient presents with  . Chest Pain  . Back Pain       Brief HPI   Whitney Boyd is a 69yo woman with PMH of breast cancer, retinal hole s/p surgery, allergies, who presentED for acute abdominal pain. Whitney Boyd reportED that after eating a "greasy" meal on the day of admission consisting of fried fish, french fries, she developed severe abdominal pain in the epigastrium that radiated to the sides and back. She noted that it felt very similar to abdominal pain she had had before with gallstones, however, she has had her gallbladder out. They pain last about 1.5 hours and was associated with nausea, belching, diaphoresis and being pale. She attempted to make herself throw up, but was unable to. She also tried to drink tea but this did not help. She presented to the ED and a GI cocktail improved the pain. She had one recurrence while here that lasted about 10 minutes. Ms. Schwaller reports that she does not drink excessively, but she does usually drink about a beer a day. She does not know if she has high cholesterol.  ED workup showed elevated lipase, elevated AST/ALT. 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    Assessment & Plan    Principal Problem:   Acute pancreatitis unclear etiology although could have passed a biliary stone - Lipase was 155 at the time of admission with transaminitis - Symptoms much improved, lipase now 34, LFTs are improving, start on clear liquid diet and advance as tolerated - Obtain right upper quadrant ultrasound, triglycerides 70, has a history of  cholecystectomy - CT abdomen did not show any biliary dilation, 5 mm dilatation of pancreatic duct stable from 2013, likely patient's baseline  Active Problems:   Transaminitis -Likely due to #1, improving     Bladder mass: 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 Discussed in detail with the patient who reported that she was recommended to follow up with urologist by her PCP in 2013 however she completely forgot about it and never followed.  [facility] urology consult, discussed with Dr. Pilar Jarvis, per urology recommendations, patient will need cystoscopy and transurethral resection, will be worked up outpatient. Does not need any inpatient workup at this time. Recommended to follow up with him in 1-2 weeks.    Tobacco abuse -Patient counseled on smoking cessation, refused nicotine patch     Environmental allergies - Continue as needed Benadryl   Code Status: Full code   Family Communication: Discussed in detail with the patient, all imaging  results, lab results explained to the patient    Disposition Plan:Hopefully tomorrow   Time Spent in minutes  25 mins  Procedures  CT abdomen and pelvis   Consults   urology  DVT Prophylaxis  Lovenox   Medications  Scheduled Meds: . enoxaparin (LOVENOX) injection  40 mg Subcutaneous Q24H  . sodium chloride  3 mL Intravenous Q12H   Continuous Infusions: . sodium chloride 125 mL/hr at 06/11/15 0915   PRN Meds:.acetaminophen, diphenhydrAMINE, gi cocktail, ondansetron **OR** ondansetron (ZOFRAN) IV, senna-docusate, traMADol   Antibiotics   Anti-infectives    None        Subjective:   Whitney Boyd was seen and examined today.  Feels a lot better today, no nausea or vomiting or abdominal pain. Patient denies dizziness, chest pain, shortness of breath, new weakness, numbess, tingling. No acute events overnight.     Objective:   Blood pressure 112/53, pulse 75, temperature 98.9 F (37.2 C), temperature source Oral, resp. rate 16, height 5' 1.2" (1.554 m), weight 70.4 kg (155 lb 3.3 oz), SpO2 95 %.  Wt Readings from Last 3 Encounters:  06/11/15 70.4 kg (155 lb 3.3 oz)  11/01/11 71.396 kg (157 lb 6.4 oz)  10/02/11 69.854 kg (154 lb)     Intake/Output Summary (Last 24 hours) at 06/11/15 0947 Last data filed at 06/11/15 0735  Gross per 24 hour  Intake 781.25 ml  Output   1350 ml  Net -568.75 ml    Exam  General: Alert and oriented x 3, NAD  HEENT:  PERRLA, EOMI, Anicteric Sclera, mucous membranes moist.   Neck: Supple, no JVD, no masses  CVS: S1 S2 auscultated, no rubs, murmurs or gallops. Regular rate and rhythm.  Respiratory: Clear to auscultation bilaterally, no wheezing, rales or rhonchi  Abdomen: Soft, nontender, nondistended, + bowel sounds  Ext: no cyanosis clubbing or edema  Neuro: AAOx3, Cr N's II- XII. Strength 5/5 upper and lower extremities bilaterally  Skin: No rashes  Psych: Normal affect and demeanor, alert and oriented x3    Data Review   Micro Results No results found for this or any previous visit (from the past 240 hour(s)).  Radiology Reports Dg Chest 2 View  06/10/2015  CLINICAL DATA:  Midline chest pain began 3 p.m. today a radiates to back, personal history of breast cancer EXAM: CHEST  2 VIEW COMPARISON:  08/19/2011 FINDINGS: The heart size and vascular pattern are normal. Stable aortic arch calcification. No pneumothorax or effusion. Status post right mastectomy. IMPRESSION: No active cardiopulmonary disease. Electronically Signed   By: Skipper Cliche M.D.   On: 06/10/2015 18:08   Ct Abdomen Pelvis W Contrast  06/10/2015  CLINICAL DATA:  Acute onset of central chest pain, radiating to the back, after eating. Nausea, vomiting and diaphoresis. Shortness of breath. Initial encounter. EXAM: CT ABDOMEN AND PELVIS WITH CONTRAST TECHNIQUE: Multidetector  CT imaging of the abdomen and pelvis was performed using the standard protocol following bolus administration of intravenous contrast. CONTRAST:  174mL OMNIPAQUE IOHEXOL 300 MG/ML  SOLN COMPARISON:  CT of the abdomen and pelvis from 08/20/2011, and MRI of the abdomen performed 08/27/2011 FINDINGS: The visualized lung bases are clear. The liver and spleen are unremarkable in appearance. The patient is status post cholecystectomy, with clips noted at the gallbladder fossa. There is dilatation of the pancreatic duct to 5 mm in diameter; this is grossly stable from 2013 and likely reflects the patient's baseline. The pancreas and adrenal glands are otherwise grossly unremarkable. The kidneys  are unremarkable in appearance. There is no evidence of hydronephrosis. No renal or ureteral stones are seen. No perinephric stranding is appreciated. No free fluid is identified. The small bowel is unremarkable in appearance. The stomach is within normal limits. No acute vascular abnormalities are seen. Scattered calcification is noted along the abdominal aorta and branches. The appendix is not definitely seen; there is no evidence of appendicitis. The colon is grossly unremarkable in appearance. The cecum is noted at the left lower quadrant. The bladder is moderately distended. There is a hazy irregular 3.3 cm mass at the left posterior aspect of the bladder, increased in size from 2013. 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 Cystoscopy and consultation for resection are recommended. The uterus is unremarkable in appearance. The ovaries are grossly symmetric. No suspicious adnexal masses are seen. No inguinal lymphadenopathy is seen. No acute osseous abnormalities are identified. Vacuum phenomenon and endplate sclerotic change are noted at L4-L5 and L5-S1. IMPRESSION: 1. Hazy irregular 3.3 cm mass at the left posterior aspect of the bladder, increased in size from 1.0 cm in 2013. The  appearance is compatible with a slow-growing transitional cell carcinoma. Cystoscopy and consultation for resection are recommended. 2. Scattered calcification along the abdominal aorta and its branches. 3. Minimal degenerative change at the lower cervical spine. 4. Scattered calcification along the abdominal aorta and its branches. 5. Stable dilatation of the pancreatic duct is unchanged from 2013 and likely reflects the patient's baseline. These results were called by telephone at the time of interpretation on 06/10/2015 at 9:39 pm to [facility] PA, who verbally acknowledged these results. Electronically Signed   By: Garald Balding M.D.   On: 06/10/2015 21:43    CBC  Recent Labs Lab 06/10/15 1731 06/11/15 0426  WBC 11.1* 6.7  HGB 15.5* 13.2  HCT 47.4* 40.5  PLT 218 187  MCV 96.1 96.2  MCH 31.4 31.4  MCHC 32.7 32.6  RDW 13.8 13.9    Chemistries   Recent Labs Lab 06/10/15 1731 06/10/15 1902 06/11/15 0426  NA 138  --  138  K 4.1  --  4.0  CL 102  --  108  CO2 27  --  23  GLUCOSE 126*  --  93  BUN 13  --  8  CREATININE 0.80  --  0.63  CALCIUM 9.3  --  8.9  AST  --  291* 245*  ALT  --  124* 257*  ALKPHOS  --  79 75  BILITOT  --  0.5 0.6   ------------------------------------------------------------------------------------------------------------------ estimated creatinine clearance is 58.1 mL/min (by C-G formula based on Cr of 0.63). ------------------------------------------------------------------------------------------------------------------ No results for input(s): HGBA1C in the last 72 hours. ------------------------------------------------------------------------------------------------------------------  Recent Labs  06/11/15 0426  CHOL 135  HDL 42  LDLCALC 79  TRIG 70  CHOLHDL 3.2   ------------------------------------------------------------------------------------------------------------------ No results for input(s): TSH, T4TOTAL, T3FREE, THYROIDAB  in the last 72 hours.  Invalid input(s): FREET3 ------------------------------------------------------------------------------------------------------------------ No results for input(s): VITAMINB12, FOLATE, FERRITIN, TIBC, IRON, RETICCTPCT in the last 72 hours.  Coagulation profile No results for input(s): INR, PROTIME in the last 168 hours.  No results for input(s): DDIMER in the last 72 hours.  Cardiac Enzymes No results for input(s): CKMB, TROPONINI, MYOGLOBIN in the last 168 hours.  Invalid input(s): CK ------------------------------------------------------------------------------------------------------------------ Invalid input(s): POCBNP   Recent Labs  06/11/15 0810  GLUCAP 74     Toriano Aikey M.D. [facility] 06/11/2015, 9:47 AM  Pager: [phone] Between 7am to 7pm -  call Pager - [phone]  After 7pm go to [email or web address] - password [email or web address]  Call night coverage person covering after 7pm

## 2015-06-12 LAB — COMPREHENSIVE METABOLIC PANEL
ALK PHOS: 73 U/L (ref 38–126)
ALT: 191 U/L — ABNORMAL HIGH (ref 14–54)
ANION GAP: 5 (ref 5–15)
AST: 120 U/L — ABNORMAL HIGH (ref 15–41)
Albumin: 3.2 g/dL — ABNORMAL LOW (ref 3.5–5.0)
BILIRUBIN TOTAL: 0.5 mg/dL (ref 0.3–1.2)
BUN: 6 mg/dL (ref 6–20)
CALCIUM: 9 mg/dL (ref 8.9–10.3)
CO2: 26 mmol/L (ref 22–32)
Chloride: 110 mmol/L (ref 101–111)
Creatinine, Ser: 0.63 mg/dL (ref 0.44–1.00)
GFR calc non Af Amer: >60 mL/min (ref >60)
GLUCOSE: 88 mg/dL (ref 65–99)
Potassium: 4.1 mmol/L (ref 3.5–5.1)
Sodium: 141 mmol/L (ref 135–145)
TOTAL PROTEIN: 6 g/dL — AB (ref 6.5–8.1)

## 2015-06-12 LAB — CBC
HCT: 44.1 % (ref 36.0–46.0)
Hemoglobin: 14.3 g/dL (ref 12.0–15.0)
MCH: 31.6 pg (ref 26.0–34.0)
MCHC: 32.4 g/dL (ref 30.0–36.0)
MCV: 97.4 fL (ref 78.0–100.0)
PLATELETS: 179 10*3/uL (ref 150–400)
RBC: 4.53 MIL/uL (ref 3.87–5.11)
RDW: 14 % (ref 11.5–15.5)
WBC: 5.1 10*3/uL (ref 4.0–10.5)

## 2015-06-12 LAB — HEPATITIS PANEL, ACUTE
HEP A IGM: NEGATIVE
Hep B C IgM: NEGATIVE
Hepatitis B Surface Ag: NEGATIVE

## 2015-06-12 LAB — LIPASE, BLOOD: Lipase: 26 U/L (ref 11–51)

## 2015-06-12 LAB — GLUCOSE, CAPILLARY: Glucose-Capillary: 89 mg/dL (ref 65–99)

## 2015-06-12 MED ORDER — PANTOPRAZOLE SODIUM 40 MG PO TBEC
40.0000 mg | DELAYED_RELEASE_TABLET | Freq: Every day | ORAL | Status: DC
  Administered 2015-06-12: 40 mg via ORAL
  Filled 2015-06-12: qty 1

## 2015-06-12 MED ORDER — TRAMADOL HCL 50 MG PO TABS: 50.0000 mg | ORAL_TABLET | Freq: Three times a day (TID) | ORAL | Status: DC | PRN

## 2015-06-12 MED ORDER — PANTOPRAZOLE SODIUM 40 MG PO TBEC: 40.0000 mg | DELAYED_RELEASE_TABLET | Freq: Every day | ORAL | Status: DC

## 2015-06-12 NOTE — Discharge Summary (Signed)
Physician Discharge Summary   Patient ID: DEYJAH MONETTE MRN: [id number] DOB/AGE: [birth year] 69 y.o.  Admit date: 06/10/2015 Discharge date: 06/12/2015  Primary Care Physician:  Lilian Coma, MD  Discharge Diagnoses:    . Acute pancreatitis . Transaminitis . Bladder mass . Tobacco abuse . Environmental allergies   GERD  Consults:   Urology, Dr Pilar Jarvis via phone consultation  Recommendations for Outpatient Follow-up:  1. Please note patient has bladder mass noted on the CT abdomen which has grown in size from 2013. I have called [facility], Dr Pilar Jarvis. Patient needs to follow-up with urology in 2 weeks, she will need cystoscopy and further management regarding the mass.  2. Please repeat CBC/BMET at next visit    TESTS THAT NEED FOLLOW-UP CMET   DIET: Heart healthy diet    Allergies:   Allergies  Allergen Reactions  . Morphine And Related Nausea And Vomiting     DISCHARGE MEDICATIONS: Current Discharge Medication List    START taking these medications   Details  pantoprazole (PROTONIX) 40 MG tablet Take 1 tablet (40 mg total) by mouth daily. Any generic brand available is okay Qty: 30 tablet, Refills: 3      CONTINUE these medications which have CHANGED   Details  traMADol (ULTRAM) 50 MG tablet Take 1 tablet (50 mg total) by mouth every 8 (eight) hours as needed. For pain Qty: 30 tablet, Refills: 0   Associated Diagnoses: Breast cancer ([place])      CONTINUE these medications which have NOT CHANGED   Details  diphenhydrAMINE (BENADRYL) 25 MG tablet Take 25 mg by mouth every 6 (six) hours as needed. For allergies    Lubricants (LUBRICATING JELLY) GEL Apply 1 drop topically daily as needed (for dry eyes).    !! Multiple Vitamin (MULITIVITAMIN WITH MINERALS) TABS Take 1 tablet by mouth every morning.    !! Multiple Vitamins-Minerals (HAIR/SKIN/NAILS) TABS Take 3 tablets by mouth daily.    pseudoephedrine (SUDAFED) 120 MG 12 hr tablet Take 120  mg by mouth daily as needed for congestion.    tamoxifen (NOLVADEX) 20 MG tablet Take 1 tablet (20 mg total) by mouth every morning. Qty: 90 tablet, Refills: 2   Associated Diagnoses: Breast cancer ([place])     !! - Potential duplicate medications found. Please discuss with provider.    STOP taking these medications     aspirin 325 MG tablet          Brief H and P: For complete details please refer to admission H and P, but in briefMs. Vick is a 69yo woman with PMH of breast cancer, retinal hole s/p surgery, allergies, who presentED for acute abdominal pain. Ms. Chadderdon reportED that after eating a "greasy" meal on the day of admission consisting of fried fish, french fries, she developed severe abdominal pain in the epigastrium that radiated to the sides and back. She noted that it felt very similar to abdominal pain she had had before with gallstones, however, she has had her gallbladder out. They pain last about 1.5 hours and was associated with nausea, belching, diaphoresis and being pale. She attempted to make herself throw up, but was unable to. She also tried to drink tea but this did not help. She presented to the ED and a GI cocktail improved the pain. She had one recurrence while here that lasted about 10 minutes. Ms. Remaly reports that she does not drink excessively, but she does usually drink about a beer a day. She does not know  if she has high cholesterol.  ED workup showed elevated lipase, elevated AST/ALT. 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   Hospital Course:   Acute pancreatitis unclear etiology although could have passed a biliary stone - Lipase was 155 at the time of admission with transaminitis, lipase improved to 34 prior to discharge, LFTs are improving. Triglycerides 70, has a history of cholecystectomy. Patient is not tolerating solid diet without any difficulty. CT abdomen did not show any biliary  dilation, 5 mm dilatation of pancreatic duct stable from 2013, likely patient's baseline. Right upper quadrant ultrasound was also done which did not show any biliary dilatation or CBD stone.    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 Discussed in detail with the patient who reported that she was recommended to follow up with urologist by her PCP in 2013 however she completely forgot about it and never followed.  [facility] urology consult, discussed with Dr. Pilar Jarvis, per urology recommendations, patient will need cystoscopy and transurethral resection, will be worked up outpatient. Does not need any inpatient workup at this time. Recommended to follow up with him in 1-2 weeks. I have called urology office and given her information, patient will be called by [facility] office for appointment.   Tobacco abuse -Patient counseled on smoking cessation, refused nicotine patch   Environmental allergies - Continue as needed Benadryl  Day of Discharge BP 121/64 mmHg  Pulse 64  Temp(Src) 98.3 F (36.8 C) (Oral)  Resp 18  Ht 5' 1.2" (1.554 m)  Wt 70.4 kg (155 lb 3.3 oz)  BMI 29.15 kg/m2  SpO2 97%  Physical Exam: General: Alert and awake oriented x3 not in any acute distress. HEENT: anicteric sclera, pupils reactive to light and accommodation CVS: S1-S2 clear no murmur rubs or gallops Chest: clear to auscultation bilaterally, no wheezing rales or rhonchi Abdomen: soft nontender, nondistended, normal bowel sounds Extremities: no cyanosis, clubbing or edema noted bilaterally Neuro: Cranial nerves II-XII intact, no focal neurological deficits   The results of significant diagnostics from this hospitalization (including imaging, microbiology, ancillary and  laboratory) are listed below for reference.    LAB RESULTS: Basic Metabolic Panel:  Recent Labs Lab 06/11/15 0426 06/12/15 0725  NA 138 141  K 4.0 4.1  CL 108 110  CO2 23 26  GLUCOSE 93 88  BUN 8 6  CREATININE 0.63 0.63  CALCIUM 8.9 9.0   Liver Function Tests:  Recent Labs Lab 06/11/15 0426 06/12/15 0725  AST 245* 120*  ALT 257* 191*  ALKPHOS 75 73  BILITOT 0.6 0.5  PROT 5.7* 6.0*  ALBUMIN 3.2* 3.2*    Recent Labs Lab 06/11/15 0654 06/12/15 0725  LIPASE 34 26   No results for input(s): AMMONIA in the last 168 hours. CBC:  Recent Labs Lab 06/11/15 0426 06/12/15 0725  WBC 6.7 5.1  HGB 13.2 14.3  HCT 40.5 44.1  MCV 96.2 97.4  PLT 187 179   Cardiac Enzymes: No results for input(s): CKTOTAL, CKMB, CKMBINDEX, TROPONINI in the last 168 hours. BNP: Invalid input(s): POCBNP CBG:  Recent Labs Lab 06/11/15 0810 06/12/15 0733  GLUCAP 89 89    Significant Diagnostic Studies:  Dg Chest 2 View  06/10/2015  CLINICAL DATA:  Midline chest pain began 3 p.m. today a radiates to back, personal history of breast cancer EXAM: CHEST  2 VIEW COMPARISON:  08/19/2011 FINDINGS: The heart size and vascular pattern are normal. Stable aortic arch calcification. No pneumothorax or effusion. Status post right mastectomy. IMPRESSION: No active cardiopulmonary disease. Electronically Signed   By: Skipper Cliche M.D.   On: 06/10/2015 18:08   Ct Abdomen Pelvis W Contrast  06/10/2015  CLINICAL DATA:  Acute onset of central chest pain, radiating to the back, after eating. Nausea, vomiting and diaphoresis. Shortness of breath. Initial encounter. EXAM: CT ABDOMEN AND PELVIS WITH CONTRAST TECHNIQUE: Multidetector CT imaging of the abdomen and pelvis was performed using the standard protocol following bolus administration of intravenous contrast. CONTRAST:  142mL OMNIPAQUE IOHEXOL 300 MG/ML  SOLN COMPARISON:  CT of the abdomen and pelvis from 08/20/2011, and MRI of the abdomen performed  08/27/2011 FINDINGS: The visualized lung bases are clear. The liver and spleen are unremarkable in appearance. The patient is status post cholecystectomy, with clips noted at the gallbladder fossa. There is dilatation of the pancreatic duct to 5 mm in diameter; this is grossly stable from 2013 and likely reflects the patient's baseline. The pancreas and adrenal glands are otherwise grossly unremarkable. The kidneys are unremarkable in appearance. There is no evidence of hydronephrosis. No renal or ureteral stones are seen. No perinephric stranding is appreciated. No free fluid is identified. The small bowel is unremarkable in appearance. The stomach is within normal limits. No acute vascular abnormalities are seen. Scattered calcification is noted along the abdominal aorta and branches. The appendix is not definitely seen; there is no evidence of appendicitis. The colon is grossly unremarkable in appearance. The cecum is noted at the left lower quadrant. The bladder is moderately distended. There is a hazy irregular 3.3 cm mass at the left posterior aspect of the bladder, increased in size from 2013. 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 Cystoscopy and consultation for resection are recommended. The uterus is unremarkable in appearance. The ovaries are grossly symmetric. No suspicious adnexal masses are seen. No inguinal lymphadenopathy is seen. No acute osseous abnormalities are identified. Vacuum phenomenon and endplate sclerotic change are noted at L4-L5 and L5-S1. IMPRESSION: 1. Hazy irregular 3.3 cm mass at the left posterior aspect of the bladder, increased in size from 1.0 cm in 2013. 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 Cystoscopy and consultation for resection are recommended. 2. Scattered calcification along the abdominal aorta and its branches. 3. Minimal degenerative change at the lower cervical spine. 4. Scattered  calcification along the abdominal aorta and its branches. 5. Stable dilatation of the pancreatic duct is unchanged from 2013 and likely reflects the patient's baseline. These results were called by telephone at the time of interpretation on 06/10/2015 at 9:39 pm to [facility] PA, who verbally acknowledged these results. Electronically Signed   By: Garald Balding M.D.   On: 06/10/2015 21:43   US Abdomen Limited Ruq  06/11/2015  CLINICAL DATA:  Evaluate for biliary stone. Acute abdominal pain/ pancreatitis. Previous cholecystectomy. EXAM: US ABDOMEN LIMITED - RIGHT UPPER QUADRANT COMPARISON:  CT 06/10/2015 and 08/20/2011 FINDINGS: Gallbladder: Previous cholecystectomy. Common bile duct: Diameter: Dilatation of the common bile duct measuring 9.3 mm likely due to patient's postcholecystectomy state. No evidence of intraductal stones. Liver: No focal lesion identified. Within normal limits in parenchymal echogenicity. IMPRESSION: Previous cholecystectomy. Prominent common bile duct measuring 9.3 mm without  intraductal stones. This is likely due to patient's postcholecystectomy state. Electronically Signed   By: Marin Olp M.D.   On: 06/11/2015 10:57    2D ECHO:   Disposition and Follow-up: Discharge Instructions    Diet - low sodium heart healthy    Complete by:  As directed      Increase activity slowly    Complete by:  As directed             DISPOSITION: Home   DISCHARGE FOLLOW-UP Follow-up Information    Follow up with Nickie Retort, MD. Schedule an appointment as soon as possible for a visit in 2 weeks.   Specialty:  Urology   Why:  I have given your information to [facility] office. They will call you with an appointment. If you donot hear from them in 2 days, please call the office on wednesday.     Contact information:   [place] [address] [phone]       Follow up with Lilian Coma, MD. Schedule an appointment as soon as possible for a  visit in 2 weeks.   Specialty:  Family Medicine   Why:  for hospital follow-up. NS unable to schedule APPT due to the office being closed until 2pm on 06/12/15. Patient must call and schedule APPT with the number stated above. NS alert RN of barrier on 06/12/15 @ 10:11am   Contact information:   [address] [place] [address] [phone]        Time spent on Discharge: 35 minutes  Signed:   RAI,RIPUDEEP M.D. [facility] 06/12/2015, 11:03 AM Pager: [phone]

## 2015-06-12 NOTE — Discharge Instructions (Signed)

## 2015-06-12 NOTE — Progress Notes (Signed)
Utilization review completed. Janaia Kozel, RN, BSN. 

## 2015-06-12 NOTE — Progress Notes (Signed)
Nsg Discharge Note  Admit Date:  06/10/2015 Discharge date: 06/12/2015   Judeth Porch A Martindelcampo to be D/C'd Home per MD order.  AVS completed.  Copy for chart, and copy for patient signed, and dated. Patient/caregiver able to verbalize understanding.  Discharge Medication:   Medication List    STOP taking these medications        aspirin 325 MG tablet      TAKE these medications        diphenhydrAMINE 25 MG tablet  Commonly known as:  BENADRYL  Take 25 mg by mouth every 6 (six) hours as needed. For allergies     HAIR/SKIN/NAILS Tabs  Take 3 tablets by mouth daily.     LUBRICATING JELLY Gel  Apply 1 drop topically daily as needed (for dry eyes).     multivitamin with minerals Tabs tablet  Take 1 tablet by mouth every morning.     pantoprazole 40 MG tablet  Commonly known as:  PROTONIX  Take 1 tablet (40 mg total) by mouth daily. Any generic brand available is okay     pseudoephedrine 120 MG 12 hr tablet  Commonly known as:  SUDAFED  Take 120 mg by mouth daily as needed for congestion.     tamoxifen 20 MG tablet  Commonly known as:  NOLVADEX  Take 1 tablet (20 mg total) by mouth every morning.     traMADol 50 MG tablet  Commonly known as:  ULTRAM  Take 1 tablet (50 mg total) by mouth every 8 (eight) hours as needed. For pain        Discharge Assessment: Filed Vitals:   06/11/15 2124 06/12/15 0510  BP: 114/54 121/64  Pulse: 75 64  Temp: 98.8 F (37.1 C) 98.3 F (36.8 C)  Resp: 18 18   Skin clean, dry and intact without evidence of skin break down, no evidence of skin tears noted. IV catheter discontinued intact. Site without signs and symptoms of complications - no redness or edema noted at insertion site, patient denies c/o pain - only slight tenderness at site.  Dressing with slight pressure applied.  D/c Instructions-Education: Discharge instructions given to patient/family with verbalized understanding. D/c education completed with patient/family including  follow up instructions, medication list, d/c activities limitations if indicated, with other d/c instructions as indicated by MD - patient able to verbalize understanding, all questions fully answered. Patient instructed to return to ED, call 911, or call MD for any changes in condition.  Patient escorted via [place], and D/C home via private auto.  Dayle Points, RN 06/12/2015 11:33 AM

## 2015-06-28 ENCOUNTER — Other Ambulatory Visit: Payer: Self-pay | Admitting: Urology

## 2015-07-16 DIAGNOSIS — Z8551 Personal history of malignant neoplasm of bladder: Secondary | ICD-10-CM

## 2015-07-16 HISTORY — DX: Personal history of malignant neoplasm of bladder: Z85.51

## 2015-07-20 ENCOUNTER — Encounter (HOSPITAL_BASED_OUTPATIENT_CLINIC_OR_DEPARTMENT_OTHER): Payer: Self-pay | Admitting: *Deleted

## 2015-07-20 NOTE — Progress Notes (Signed)
NPO AFTER MN.  ARRIVE AT 0600.  NEEDS CBC, BMET, PT/INR, PTT. (PT LIVES OUT OF TOWN)  WILL TAKE PROTONIX AM DOS W/ SIPS OF WATER.

## 2015-07-24 NOTE — Anesthesia Preprocedure Evaluation (Addendum)
Anesthesia Evaluation  Patient identified by MRN, date of birth, ID band Patient awake    Reviewed: Allergy & Precautions, NPO status , Patient's Chart, lab work & pertinent test results  History of Anesthesia Complications (+) PONV and history of anesthetic complications  Airway Mallampati: II  TM Distance: >3 FB Neck ROM: Full    Dental  (+) Teeth Intact   Pulmonary Current Smoker,    breath sounds clear to auscultation       Cardiovascular negative cardio ROS   Rhythm:Regular Rate:Normal     Neuro/Psych negative neurological ROS  negative psych ROS   GI/Hepatic Neg liver ROS, GERD  Medicated,  Endo/Other  negative endocrine ROS  Renal/GU negative Renal ROS  negative genitourinary   Musculoskeletal negative musculoskeletal ROS (+)   Abdominal   Peds negative pediatric ROS (+)  Hematology negative hematology ROS (+)   Anesthesia Other Findings   Reproductive/Obstetrics negative OB ROS                           05/2015 EKG: normal sinus rhythm.  Lab Results  Component Value Date   WBC 9.3 07/25/2015   HGB 14.5 07/25/2015   HCT 45.4 07/25/2015   MCV 97.4 07/25/2015   PLT 248 07/25/2015     Anesthesia Physical Anesthesia Plan  ASA: II  Anesthesia Plan: General   Post-op Pain Management:    Induction: Intravenous  Airway Management Planned: LMA  Additional Equipment:   Intra-op Plan:   Post-operative Plan: Extubation in OR  Informed Consent: I have reviewed the patients History and Physical, chart, labs and discussed the procedure including the risks, benefits and alternatives for the proposed anesthesia with the patient or authorized representative who has indicated his/her understanding and acceptance.   Dental advisory given  Plan Discussed with: CRNA  Anesthesia Plan Comments: (Possible LMA.)      Anesthesia Quick Evaluation

## 2015-07-25 ENCOUNTER — Encounter (HOSPITAL_BASED_OUTPATIENT_CLINIC_OR_DEPARTMENT_OTHER): Payer: Self-pay

## 2015-07-25 ENCOUNTER — Ambulatory Visit (HOSPITAL_BASED_OUTPATIENT_CLINIC_OR_DEPARTMENT_OTHER): Payer: Medicare Other | Admitting: Anesthesiology

## 2015-07-25 ENCOUNTER — Encounter (HOSPITAL_BASED_OUTPATIENT_CLINIC_OR_DEPARTMENT_OTHER)
Admission: RE | Disposition: A | Discharge status: Home / Self Care | Payer: Self-pay | Source: Ambulatory Visit | Attending: Urology

## 2015-07-25 ENCOUNTER — Ambulatory Visit (HOSPITAL_BASED_OUTPATIENT_CLINIC_OR_DEPARTMENT_OTHER)
Admission: RE | Admit: 2015-07-25 | Discharge: 2015-07-25 | Disposition: A | Discharge status: Home / Self Care | Payer: Medicare Other | Source: Ambulatory Visit | Attending: Urology | Admitting: Urology

## 2015-07-25 ENCOUNTER — Ambulatory Visit (HOSPITAL_COMMUNITY): Payer: Medicare Other

## 2015-07-25 DIAGNOSIS — D494 Neoplasm of unspecified behavior of bladder: Secondary | ICD-10-CM

## 2015-07-25 DIAGNOSIS — N3289 Other specified disorders of bladder: Secondary | ICD-10-CM

## 2015-07-25 DIAGNOSIS — N390 Urinary tract infection, site not specified: Secondary | ICD-10-CM | POA: Diagnosis not present

## 2015-07-25 DIAGNOSIS — F17210 Nicotine dependence, cigarettes, uncomplicated: Secondary | ICD-10-CM | POA: Diagnosis not present

## 2015-07-25 DIAGNOSIS — Z79899 Other long term (current) drug therapy: Secondary | ICD-10-CM | POA: Insufficient documentation

## 2015-07-25 DIAGNOSIS — C679 Malignant neoplasm of bladder, unspecified: Secondary | ICD-10-CM | POA: Insufficient documentation

## 2015-07-25 DIAGNOSIS — Z853 Personal history of malignant neoplasm of breast: Secondary | ICD-10-CM | POA: Insufficient documentation

## 2015-07-25 DIAGNOSIS — Z9049 Acquired absence of other specified parts of digestive tract: Secondary | ICD-10-CM | POA: Insufficient documentation

## 2015-07-25 DIAGNOSIS — Z7982 Long term (current) use of aspirin: Secondary | ICD-10-CM | POA: Insufficient documentation

## 2015-07-25 DIAGNOSIS — K219 Gastro-esophageal reflux disease without esophagitis: Secondary | ICD-10-CM | POA: Insufficient documentation

## 2015-07-25 HISTORY — DX: Other allergy status, other than to drugs and biological substances: Z91.09

## 2015-07-25 HISTORY — PX: CYSTOSCOPY WITH STENT PLACEMENT: SHX5790

## 2015-07-25 HISTORY — DX: Gastro-esophageal reflux disease without esophagitis: K21.9

## 2015-07-25 HISTORY — DX: Personal history of malignant neoplasm of breast: Z85.3

## 2015-07-25 HISTORY — DX: Neoplasm of unspecified behavior of bladder: D49.4

## 2015-07-25 HISTORY — PX: TRANSURETHRAL RESECTION OF BLADDER TUMOR WITH GYRUS (TURBT-GYRUS): SHX6458

## 2015-07-25 HISTORY — PX: CYSTOSCOPY W/ RETROGRADES: SHX1426

## 2015-07-25 HISTORY — DX: Personal history of other diseases of the digestive system: Z87.19

## 2015-07-25 LAB — BASIC METABOLIC PANEL
Anion gap: 8 (ref 5–15)
BUN: 16 mg/dL (ref 6–20)
CALCIUM: 9.5 mg/dL (ref 8.9–10.3)
CO2: 28 mmol/L (ref 22–32)
CREATININE: 0.65 mg/dL (ref 0.44–1.00)
Chloride: 107 mmol/L (ref 101–111)
GFR calc Af Amer: >60 mL/min (ref >60)
GLUCOSE: 115 mg/dL — AB (ref 65–99)
Potassium: 4.1 mmol/L (ref 3.5–5.1)
Sodium: 143 mmol/L (ref 135–145)

## 2015-07-25 LAB — CBC
HCT: 45.4 % (ref 36.0–46.0)
Hemoglobin: 14.5 g/dL (ref 12.0–15.0)
MCH: 31.1 pg (ref 26.0–34.0)
MCHC: 31.9 g/dL (ref 30.0–36.0)
MCV: 97.4 fL (ref 78.0–100.0)
Platelets: 248 10*3/uL (ref 150–400)
RBC: 4.66 MIL/uL (ref 3.87–5.11)
RDW: 13.2 % (ref 11.5–15.5)
WBC: 9.3 10*3/uL (ref 4.0–10.5)

## 2015-07-25 LAB — APTT: APTT: 32 s (ref 24–37)

## 2015-07-25 LAB — PROTIME-INR
INR: 0.96 (ref 0.00–1.49)
PROTHROMBIN TIME: 13 s (ref 11.6–15.2)

## 2015-07-25 SURGERY — TRANSURETHRAL RESECTION OF BLADDER TUMOR WITH GYRUS (TURBT-GYRUS)
Anesthesia: General | Site: Ureter

## 2015-07-25 MED ORDER — CEPHALEXIN 500 MG PO CAPS: 500.0000 mg | ORAL_CAPSULE | Freq: Three times a day (TID) | ORAL | Status: DC

## 2015-07-25 MED ORDER — MIRABEGRON ER 25 MG PO TB24
25.0000 mg | ORAL_TABLET | Freq: Every day | ORAL | Status: DC
  Filled 2015-07-25: qty 1

## 2015-07-25 MED ORDER — HYDROCODONE-ACETAMINOPHEN 5-325 MG PO TABS: 1.0000 | ORAL_TABLET | Freq: Four times a day (QID) | ORAL | Status: DC | PRN

## 2015-07-25 MED ORDER — DEXAMETHASONE SODIUM PHOSPHATE 10 MG/ML IJ SOLN
INTRAMUSCULAR | Status: AC
  Filled 2015-07-25: qty 1

## 2015-07-25 MED ORDER — CEFAZOLIN SODIUM-DEXTROSE 2-3 GM-% IV SOLR
INTRAVENOUS | Status: AC
  Filled 2015-07-25: qty 50

## 2015-07-25 MED ORDER — PROPOFOL 10 MG/ML IV BOLUS
INTRAVENOUS | Status: DC | PRN
  Administered 2015-07-25: 150 mg via INTRAVENOUS

## 2015-07-25 MED ORDER — OXYCODONE HCL 5 MG PO TABS
5.0000 mg | ORAL_TABLET | Freq: Once | ORAL | Status: AC
  Administered 2015-07-25: 5 mg via ORAL
  Filled 2015-07-25: qty 1

## 2015-07-25 MED ORDER — LACTATED RINGERS IV SOLN
INTRAVENOUS | Status: DC
  Filled 2015-07-25: qty 1000

## 2015-07-25 MED ORDER — LIDOCAINE HCL (CARDIAC) 20 MG/ML IV SOLN
INTRAVENOUS | Status: DC | PRN
  Administered 2015-07-25: 80 mg via INTRAVENOUS

## 2015-07-25 MED ORDER — SCOPOLAMINE 1 MG/3DAYS TD PT72
MEDICATED_PATCH | TRANSDERMAL | Status: DC | PRN
  Administered 2015-07-25: 1 via TRANSDERMAL

## 2015-07-25 MED ORDER — LACTATED RINGERS IV SOLN
INTRAVENOUS | Status: DC
Start: 2015-07-25 — End: 2015-07-25
  Administered 2015-07-25: 06:00:00 via INTRAVENOUS
  Filled 2015-07-25: qty 1000

## 2015-07-25 MED ORDER — IOHEXOL 350 MG/ML SOLN
INTRAVENOUS | Status: DC | PRN
  Administered 2015-07-25: 18 mL

## 2015-07-25 MED ORDER — FENTANYL CITRATE (PF) 100 MCG/2ML IJ SOLN
25.0000 ug | INTRAMUSCULAR | Status: DC | PRN
  Administered 2015-07-25 (×2): 50 ug via INTRAVENOUS
  Filled 2015-07-25: qty 1

## 2015-07-25 MED ORDER — MIRABEGRON ER 25 MG PO TB24
25.0000 mg | ORAL_TABLET | Freq: Once | ORAL | Status: DC
  Filled 2015-07-25: qty 1

## 2015-07-25 MED ORDER — FENTANYL CITRATE (PF) 100 MCG/2ML IJ SOLN
INTRAMUSCULAR | Status: AC
  Filled 2015-07-25: qty 2

## 2015-07-25 MED ORDER — DEXAMETHASONE SODIUM PHOSPHATE 4 MG/ML IJ SOLN
INTRAMUSCULAR | Status: DC | PRN
  Administered 2015-07-25: 10 mg via INTRAVENOUS

## 2015-07-25 MED ORDER — FENTANYL CITRATE (PF) 100 MCG/2ML IJ SOLN
INTRAMUSCULAR | Status: DC | PRN
  Administered 2015-07-25 (×2): 50 ug via INTRAVENOUS

## 2015-07-25 MED ORDER — LIDOCAINE HCL (CARDIAC) 20 MG/ML IV SOLN
INTRAVENOUS | Status: AC
  Filled 2015-07-25: qty 5

## 2015-07-25 MED ORDER — CEFAZOLIN SODIUM 1 G IJ SOLR
INTRAMUSCULAR | Status: AC
  Filled 2015-07-25: qty 10

## 2015-07-25 MED ORDER — ONDANSETRON HCL 4 MG/2ML IJ SOLN
INTRAMUSCULAR | Status: AC
  Filled 2015-07-25: qty 2

## 2015-07-25 MED ORDER — ACETAMINOPHEN 10 MG/ML IV SOLN
INTRAVENOUS | Status: AC
  Filled 2015-07-25: qty 100

## 2015-07-25 MED ORDER — OXYCODONE HCL 5 MG PO TABS
ORAL_TABLET | ORAL | Status: AC
  Filled 2015-07-25: qty 1

## 2015-07-25 MED ORDER — MIDAZOLAM HCL 2 MG/2ML IJ SOLN
INTRAMUSCULAR | Status: AC
  Filled 2015-07-25: qty 2

## 2015-07-25 MED ORDER — SCOPOLAMINE 1 MG/3DAYS TD PT72
MEDICATED_PATCH | TRANSDERMAL | Status: AC
  Filled 2015-07-25: qty 1

## 2015-07-25 MED ORDER — MIDAZOLAM HCL 5 MG/5ML IJ SOLN
INTRAMUSCULAR | Status: DC | PRN
  Administered 2015-07-25: 2 mg via INTRAVENOUS

## 2015-07-25 MED ORDER — SODIUM CHLORIDE 0.9 % IJ SOLN
INTRAMUSCULAR | Status: AC
  Filled 2015-07-25: qty 10

## 2015-07-25 MED ORDER — ACETAMINOPHEN 10 MG/ML IV SOLN
INTRAVENOUS | Status: DC | PRN
  Administered 2015-07-25: 1000 mg via INTRAVENOUS

## 2015-07-25 MED ORDER — PROPOFOL 10 MG/ML IV BOLUS
INTRAVENOUS | Status: AC
  Filled 2015-07-25: qty 40

## 2015-07-25 MED ORDER — SODIUM CHLORIDE 0.9 % IR SOLN
Status: DC | PRN
  Administered 2015-07-25: 8000 mL

## 2015-07-25 MED ORDER — CEFAZOLIN SODIUM-DEXTROSE 2-3 GM-% IV SOLR
2.0000 g | INTRAVENOUS | Status: AC
  Administered 2015-07-25: 2 g via INTRAVENOUS
  Filled 2015-07-25: qty 50

## 2015-07-25 MED ORDER — ONDANSETRON HCL 4 MG/2ML IJ SOLN
INTRAMUSCULAR | Status: DC | PRN
  Administered 2015-07-25: 4 mg via INTRAVENOUS

## 2015-07-25 SURGICAL SUPPLY — 33 items
ADAPTER IRRIG TUBE 2 SPIKE SOL (ADAPTER) ×2 IMPLANT
ADPR TBG 2 SPK PMP STRL ASCP (ADAPTER) ×6
BAG DRN ANRFLXCHMBR STRAP LEK (BAG)
BAG URINE DRAINAGE (UROLOGICAL SUPPLIES) ×1 IMPLANT
BAG URINE LEG 19OZ MD ST LTX (BAG) IMPLANT
BAG URINE LEG 500ML (DRAIN) IMPLANT
BAG URO CATCHER STRL LF (MISCELLANEOUS) ×4 IMPLANT
CATH FOLEY 2WAY SLVR  5CC 22FR (CATHETERS)
CATH FOLEY 2WAY SLVR 30CC 20FR (CATHETERS) IMPLANT
CATH FOLEY 2WAY SLVR 5CC 22FR (CATHETERS) IMPLANT
CATH INTERMIT  6FR 70CM (CATHETERS) ×1 IMPLANT
CLOTH BEACON ORANGE TIMEOUT ST (SAFETY) ×4 IMPLANT
ELECT BIVAP BIPO 22/24 DONUT (ELECTROSURGICAL)
ELECT REM PT RETURN 9FT ADLT (ELECTROSURGICAL) ×4
ELECTRD BIVAP BIPO 22/24 DONUT (ELECTROSURGICAL) IMPLANT
ELECTRODE REM PT RTRN 9FT ADLT (ELECTROSURGICAL) ×3 IMPLANT
EVACUATOR MICROVAS BLADDER (UROLOGICAL SUPPLIES) IMPLANT
GLOVE BIO SURGEON STRL SZ7.5 (GLOVE) ×4 IMPLANT
GLOVE SURG SS PI 7.5 STRL IVOR (GLOVE) ×2 IMPLANT
GOWN STRL REUS W/ TWL LRG LVL3 (GOWN DISPOSABLE) ×3 IMPLANT
GOWN STRL REUS W/TWL LRG LVL3 (GOWN DISPOSABLE) ×5 IMPLANT
GUIDEWIRE STR DUAL SENSOR (WIRE) ×1 IMPLANT
IV NS 1000ML (IV SOLUTION) ×32
IV NS 1000ML BAXH (IV SOLUTION) IMPLANT
KIT ROOM TURNOVER WOR (KITS) ×4 IMPLANT
LOOP CUT BIPOLAR 24F LRG (ELECTROSURGICAL) ×1 IMPLANT
MANIFOLD NEPTUNE II (INSTRUMENTS) ×1 IMPLANT
NS IRRIG 500ML POUR BTL (IV SOLUTION) ×1 IMPLANT
PACK CYSTO (CUSTOM PROCEDURE TRAY) ×4 IMPLANT
SET ASPIRATION TUBING (TUBING) IMPLANT
STENT URET 6FRX24 CONTOUR (STENTS) ×1 IMPLANT
SYRINGE IRR TOOMEY STRL 70CC (SYRINGE) IMPLANT
TUBE CONNECTING 12X1/4 (SUCTIONS) IMPLANT

## 2015-07-25 NOTE — Transfer of Care (Signed)
Immediate Anesthesia Transfer of Care Note  Patient: Whitney Boyd  Procedure(s) Performed: Procedure(s): TRANSURETHRAL RESECTION OF BLADDER TUMOR WITH GYRUS (TURBT-GYRUS) (N/A) CYSTOSCOPY WITH RETROGRADE PYELOGRAM (Bilateral) CYSTOSCOPY WITH STENT PLACEMENT (Left)  Patient Location: PACU  Anesthesia Type:General  Level of Consciousness: awake, alert  and oriented  Airway & Oxygen Therapy: Patient Spontanous Breathing and Patient connected to nasal cannula oxygen  Post-op Assessment: Report given to RN  Post vital signs: Reviewed and stable  Last Vitals:  Filed Vitals:   07/25/15 0607  BP: 132/64  Pulse: 78  Temp: 37 C  Resp: 16    Complications: No apparent anesthesia complications

## 2015-07-25 NOTE — Anesthesia Procedure Notes (Signed)
Procedure Name: LMA Insertion Date/Time: 07/25/2015 7:37 AM Performed by: Bethena Roys T Pre-anesthesia Checklist: Patient identified, Emergency Drugs available, Suction available and Patient being monitored Patient Re-evaluated:Patient Re-evaluated prior to inductionOxygen Delivery Method: Circle System Utilized Preoxygenation: Pre-oxygenation with 100% oxygen Intubation Type: IV induction Ventilation: Mask ventilation without difficulty LMA: LMA with gastric port inserted LMA Size: 4.0 Number of attempts: 1 Placement Confirmation: positive ETCO2 Tube secured with: Tape Dental Injury: Teeth and Oropharynx as per pre-operative assessment

## 2015-07-25 NOTE — Op Note (Signed)
Date of procedure: 07/25/2015  Preoperative diagnosis:  1. Bladder mass   Postoperative diagnosis:  1. Bladder mass   Procedure: 1. Transurethral resection of bladder tumor- 6 cm 2. Bilateral retrograde polygrams interpretation 3. Left ureteral stent placement (6 [place] by 24 cm)  Surgeon: Baruch Gouty, MD  Anesthesia: General  Complications: None  Intraoperative findings: A 6 cm tumor over the left intramural segment of the ureter. Successful resection. Bilateral retrograde pyelograms were unremarkable. Fluoroscopy confirmed left renal stent placement in the correct position.  EBL: None  Specimens: Bladder tumor, base of bladder tumor  Drains: 6 French by 24 cm left double-J ureteral stent, 20 French fully catheter  Disposition: Stable to the postanesthesia care unit  Indication for procedure: The patient is a 70 y.o. female with 3 cm bladder tumor which was first seen in 2013 it was 2 cm in size. The patient elected not to undergo resection of that time. She also presents for bilateral retrograde pyelograms to evaluate her upper tracts.  After reviewing the management options for treatment, the patient elected to proceed with the above surgical procedure(s). We have discussed the potential benefits and risks of the procedure, side effects of the proposed treatment, the likelihood of the patient achieving the goals of the procedure, and any potential problems that might occur during the procedure or recuperation. Informed consent has been obtained.  Description of procedure: The patient was met in the preoperative area. All risks, benefits, and indications of the procedure were described in great detail. The patient consented to the procedure. Preoperative antibiotics were given. The patient was taken to the operative theater. General anesthesia was induced per the anesthesia service. The patient was then placed in the dorsal lithotomy position and prepped and draped in the usual  sterile fashion. A preoperative timeout was called. A 21 French 30 cystoscope was inserted into the patient's bladder per urethra atraumatically. Pan cystoscopy revealed a 6 cm bladder tumor over the left intramural ureter and lateral to the ureteral orifice. No other tumors were visualized. Bilateral retrograde pyelograms were then obtained which were unremarkable for filling defects or irregularities. Due to the proximity of the tumor to the left intramural ureter and ureteral orifice, the opening catheter was left in the left ureter and the cystoscope was removed. A 21st French resectoscope was then inserted and the patient's bladder per urethra. The 6 cm tumor was then resected down to muscle. The ureteral catheter was not visualized at the end of the procedure suggesting the left ureter to be intact. Muscle wasn't visible at the base the resection. After all visible tumors resected, was evacuated with an Engineer, maintenance. These were sent to pathology. Using rigid biopsy forceps, a biopsy was obtained at the base of the resection bed and sent as tumor base to pathology. Hemostasis was then obtained and was excellent. Due to the proximity to the left ureter, decision was made to leave the left renal stent. A sensor wire was exchanged for a ureteral catheter under fluoroscopy after placing contrast dye in the left renal pelvis for visualization. Cisco was inserted over the sensor wire a 6 [place] by 24 cm double-J ureteral stent was placed under fluoroscopy and the sensor wire removed. The stent was confirmed to be in the correct place with a curl seen in the patient's renal pelvis on fluoroscopy necrosis in the patient's urinary bladder with direct visualization. Hemostasis again this time was excellent. There no more tumor fragments in the bladder. Due to the size resection  decision was made to leave a Foley catheter. Cystoscope was removed. The 44 French Foley catheter was placed. With clear drainage. The patient  was woke from anesthesia and transferred stable condition post care unit.  PlaThe patient will follow-up in 3 days for Foley removal. She'll follow-up for pathology and left renal stent removal in a few weeks.  Baruch Gouty, M.D.

## 2015-07-25 NOTE — Anesthesia Postprocedure Evaluation (Signed)
Anesthesia Post Note  Patient: Whitney Boyd  Procedure(s) Performed: Procedure(s) (LRB): TRANSURETHRAL RESECTION OF BLADDER TUMOR WITH GYRUS (TURBT-GYRUS) (N/A) CYSTOSCOPY WITH RETROGRADE PYELOGRAM (Bilateral) CYSTOSCOPY WITH STENT PLACEMENT (Left)  Patient location during evaluation: PACU Anesthesia Type: General Level of consciousness: awake and alert Pain management: pain level controlled Vital Signs Assessment: post-procedure vital signs reviewed and stable Respiratory status: spontaneous breathing, nonlabored ventilation, respiratory function stable and patient connected to nasal cannula oxygen Cardiovascular status: blood pressure returned to baseline and stable Postop Assessment: no signs of nausea or vomiting Anesthetic complications: no    Last Vitals:  Filed Vitals:   07/25/15 0900 07/25/15 1018  BP: 151/70 158/81  Pulse: 62 62  Temp:  36.9 C  Resp: 15 14    Last Pain:  Filed Vitals:   07/25/15 1226  PainSc: Melbeta Aspen Deterding

## 2015-07-25 NOTE — H&P (Signed)
Chief Complaint   Bladder Mass   History of Present Illness   The patient is a 70 year old female with no urological history who presents for a bladder mass. The mass was seen on CT scan performed for abdominal pain. It is 3 cm on the left lateral wall. Was apparently there and a CT scan a few years ago and has grown in size. The patient denies any genitourinary problems at this time. She denies hematuria. She does have a history of breast cancer in 2008.     Past Medical History Problems  1. History of breast cancer (Z85.3)  Surgical History Problems  1. History of Breast Surgery Lumpectomy 2. History of Cholecystectomy 3. History of Eye Surgery  Current Meds 1. Aspirin 81 MG TABS;  Therapy: (Recorded:13Dec2016) to Recorded 2. Benadryl TABS;  Therapy: (Recorded:13Dec2016) to Recorded 3. Pantoprazole Sodium 40 MG Oral Tablet Delayed Release;  Therapy: (Recorded:13Dec2016) to Recorded 4. TraMADol HCl - 50 MG Oral Tablet;  Therapy: (Recorded:13Dec2016) to Recorded  Allergies Medication  1. Morphine Derivatives  Family History Problems  1. Family history of malignant neoplasm of breast (Z80.3) : Mother 2. Family history of myocardial infarction (Z82.49) : Mother  Social History Problems    Alcohol use (Z78.9)   5 per wk   Caffeine use (F15.90)   Father deceased   cancer unspecified   Mother deceased   37 heart attack   Number of children   1 son/ 1 daughter   Retired   Single  Review of Systems Genitourinary, constitutional, skin, eye, otolaryngeal, hematologic/lymphatic, cardiovascular, pulmonary, endocrine, musculoskeletal, gastrointestinal, neurological and psychiatric system(s) were reviewed and pertinent findings if present are noted and are otherwise negative.    Vitals Vital Signs [Data Includes: Last 1 Day]  Recorded: 13Dec2016 08:59AM  Height: 5 ft 1 in Weight: 154 lb  BMI Calculated: 29.1 BSA Calculated: 1.69 Blood Pressure: 163 /  86 Temperature: 97.5 F Heart Rate: 76 Respiration: 18  Physical Exam Constitutional: Well nourished . No acute distress.   ENT:. The ears and nose are normal in appearance.   Neck: The appearance of the neck is normal.   Pulmonary: No respiratory distress.   Cardiovascular:. No peripheral edema.   Abdomen: The abdomen is not distended. The abdomen is soft and nontender.   Genitourinary:. Deferred.    Skin: Normal skin turgor and no visible rash.   Neuro/Psych:. Mood and affect are appropriate. No focal sensory deficits.    Results/Data T ABDOMEN AND PELVIS WITH CONTRAST     TECHNIQUE:  Multidetector CT imaging of the abdomen and pelvis was performed  using the standard protocol following bolus administration of  intravenous contrast.     CONTRAST: 172mL OMNIPAQUE IOHEXOL 300 MG/ML SOLN     COMPARISON: CT of the abdomen and pelvis from 08/20/2011, and MRI  of the abdomen performed 08/27/2011     FINDINGS:  The visualized lung bases are clear.     The liver and spleen are unremarkable in appearance. The patient is  status post cholecystectomy, with clips noted at the gallbladder  fossa. 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 The pancreas and adrenal glands are otherwise  grossly unremarkable.     The kidneys are unremarkable in appearance. There is no evidence of  hydronephrosis. No renal or ureteral stones are seen. No perinephric  stranding is appreciated.     No free fluid  is identified. The small bowel is unremarkable in  appearance. The stomach is within normal limits. No acute vascular  abnormalities are seen. Scattered calcification is noted along the  abdominal aorta and branches.     The appendix is not definitely seen; there is no evidence of  appendicitis. The colon is grossly unremarkable in appearance. The  cecum  is noted at the left lower quadrant.     The bladder is moderately distended. There is a hazy irregular 3.3  cm mass at the left posterior aspect of the bladder, increased in  size from 2013. 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 Cystoscopy and consultation for  resection are recommended.     The uterus is unremarkable in appearance. The ovaries are grossly  symmetric. No suspicious adnexal masses are seen. No inguinal  lymphadenopathy is seen.     No acute osseous abnormalities are identified. Vacuum phenomenon and  endplate sclerotic change are noted at L4-L5 and L5-S1.     IMPRESSION:  1. Hazy irregular 3.3 cm mass at the left posterior aspect of the  bladder, increased in size from 1.0 cm in 2013. 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  Cystoscopy and consultation for resection are recommended.  2. Scattered calcification along the abdominal aorta and its  branches.  3. Minimal degenerative change at the lower cervical spine.  4. Scattered calcification along the abdominal aorta and its  branches.  5. Stable dilatation of the pancreatic duct is unchanged from 2013  and likely reflects the patient's baseline.     Assessment Assessed  1. Bladder tumor (D49.4) 2. Surgery, elective (Z41.9) 3. UTI (urinary tract infection) (N39.0)  Plan Bladder tumor, Health Maintenance, History of UTI, Surgery, elective, UTI (urinary tract infection)  1. URINE CULTURE; Status:In Progress - Specimen/Data Collected;   Done: [id number] Health Maintenance  2. UA With REFLEX; [Do Not Release]; Status:Complete;   DoneXH:[id number] 08:58AM    I discussed with the patient that the tumor in her bladder is very concerning for a bladder cancer. I discussed the treatment options with the patient. She is aware the first step would be a transurethral resection of the bladder tumor. Given the high  chance of this being a cancer, we will also perform retrograde pyelograms at the time of this procedure to ensure no involvement of the upper tracts. She is aware of the risks, benefits, indications of this procedure. She understands the risks include, but are not limited to, bleeding, infection, and need for Foley catheter after the procedure. She is agreeable and wishes to proceed.    1. Bladder tumor  -TURBT/bilateral retrograde pyelogram

## 2015-07-25 NOTE — Discharge Instructions (Signed)
Transurethral Resection, Bladder Tumor A cancerous growth (tumor) can develop on the inside wall of the bladder. The bladder is the organ that holds urine. One way to remove the tumor is a procedure called a transurethral resection. The tumor is removed (resected) through the tube that carries urine from the bladder out of the body (urethra). No cuts (incisions) are made in the skin. Instead, the procedure is done through a thin telescope, called a resectoscope. Attached to it is a light and usually a tiny camera. The resectoscope is put into the urethra. In men, the urethra opens at the end of the penis. In women, it opens just above the vagina.  A transurethral resection is usually used to remove tumors that have not gotten too big or too deep. These are called Stage 0, Stage 1 or Stage 2 bladder cancers. LET YOUR CAREGIVER KNOW ABOUT:  On the day of the procedure, your caregivers will need to know the last time you had anything to eat or drink. This includes water, gum, and candy. In advance, make sure they know about:   Any allergies.  All medications you are taking, including:  Herbs, eyedrops, over-the-counter medications and creams.  Blood thinners (anticoagulants), aspirin or other drugs that could affect blood clotting.  Use of steroids (by mouth or as creams).  Previous problems with anesthetics, including local anesthetics.  Possibility of pregnancy, if this applies.  Any history of blood clots.  Any history of bleeding or other blood problems.  Previous surgery.  Smoking history.  Any recent symptoms of colds or infections.  Other health problems. RISKS AND COMPLICATIONS This is usually a safe procedure. Every procedure has risks, though. For a transurethral resection, they include:  Infection.  Bleeding.  Light bleeding may last for several days after the procedure.  If bleeding continues or is heavy, the bladder may need rinsing. Or, a new catheter might be put  in for awhile.  Sometimes bed rest is needed.  Urination problems.  Pain and burning can occur when urinating. This usually goes away in a few days.  Scarring from the procedure can block the flow of urine.  Bladder damage.  It can be punctured or torn during removal of the tumor. .Urine can leak through the hole or tear into the abdomen. If this happens, surgery may be needed to repair the bladder.  ). Ask your surgeon what to expect.  You must understand how the procedure is done and why.  You must be told all the risks and benefits of the procedure.  You must sign the consent. Sometimes a legal guardian can do this.  Signing should be witnessed by a healthcare professional.  The day before the surgery, eat only a light dinner. Then, do not eat or drink anything for at least 8 hours before the surgery. Ask your caregiver if it is OK to take any needed medicines with a sip of water.  Arrive at least an hour before the surgery or whenever your surgeon recommends. This will give you time to check in and fill out any needed paperwork. PROCEDURE  The preparation:  You will change into a hospital gown.  A needle will be inserted in your arm. This is an intravenous access tube (IV). Medication will be able to flow directly into your body through this needle.  Small monitors will be put on your body. They are used to check your heart, blood pressure, and oxygen level.  You might be given medication that will  help you relax (sedative).  You will be given a general anesthetic or spinal anesthesia.  The procedure:  Once you are asleep or numb from the waist down, your legs will be placed in stirrups.  The resectoscope will be passed through the urethra into the bladder.  Fluid will be passed through the resectoscope. This will fill the bladder with water.  The surgeon will examine the bladder through the scope. If the scope has a camera, it can take pictures from inside the  bladder. They can be projected onto a TV screen.  The surgeon will use various tools to remove the tumor in small pieces. Sometimes a laser (a beam of light energy) is used. Other tools may use electric current.  A tube (catheter) will often be placed so that urine can drain into a bag outside the body. This process helps stop bleeding. This tube keeps blood clots from blocking the urethra.  The procedure usually takes 30 to 45 minutes. AFTER THE PROCEDURE   You will stay in a recovery area until the anesthesia has worn off. Your blood pressure and pulse will be checked every so often. Then you will be taken to a hospital room.  You may continue to get fluids through the IV for awhile.  Some pain is normal. The catheter might be uncomfortable. Pain is usually not severe. If it is, ask for pain medicine.  Your urine may look bloody after a transurethral resection. This is normal.  If bleeding is heavy, a hospital caregiver may rinse out the bladder (irrigation) through the catheter. Once the urine is clear, the catheter will be taken outFoley Catheter Care, Adult A Foley catheter is a soft, flexible tube that is placed into the bladder to drain urine. A Foley catheter may be inserted if: You leak urine or are not able to control when you urinate (urinary incontinence). You are not able to urinate when you need to (urinary retention). You had prostate surgery or surgery on the genitals. You have certain medical conditions, such as multiple sclerosis, dementia, or a spinal cord injury. If you are going home with a Foley catheter in place, follow the instructions below.  Post Anesthesia Home Care Instructions  Activity: Get plenty of rest for the remainder of the day. A responsible adult should stay with you for 24 hours following the procedure.  For the next 24 hours, DO NOT: -Drive a car -Paediatric nurse -Drink alcoholic beverages -Take any medication unless instructed by your  physician -Make any legal decisions or sign important papers.  Meals: Start with liquid foods such as gelatin or soup. Progress to regular foods as tolerated. Avoid greasy, spicy, heavy foods. If nausea and/or vomiting occur, drink only clear liquids until the nausea and/or vomiting subsides. Call your physician if vomiting continues.  Special Instructions/Symptoms: Your throat may feel dry or sore from the anesthesia or the breathing tube placed in your throat during surgery. If this causes discomfort, gargle with warm salt water. The discomfort should disappear within 24 hours.  If you had a scopolamine patch placed behind your ear for the management of post- operative nausea and/or vomiting:  1. The medication in the patch is effective for 72 hours, after which it should be removed.  Wrap patch in a tissue and discard in the trash. Wash hands thoroughly with soap and water. 2. You may remove the patch earlier than 72 hours if you experience unpleasant side effects which may include dry mouth, dizziness or visual disturbances.  3. Avoid touching the patch. Wash your hands with soap and water after contact with the patch.    TAKING CARE OF THE CATHETER Wash your hands with soap and water. Using mild soap and warm water on a clean washcloth: Clean the area on your body closest to the catheter insertion site using a circular motion, moving away from the catheter. Never wipe toward the catheter because this could sweep bacteria up into the urethra and cause infection. Remove all traces of soap. Pat the area dry with a clean towel. For males, reposition the foreskin. Attach the catheter to your leg so there is no tension on the catheter. Use adhesive tape or a leg strap. If you are using adhesive tape, remove any sticky residue left behind by the previous tape you used. Keep the drainage bag below the level of the bladder, but keep it off the floor. Check throughout the day to be sure the catheter  is working and urine is draining freely. Make sure the tubing does not become kinked. Do not pull on the catheter or try to remove it. Pulling could damage internal tissues. TAKING CARE OF THE DRAINAGE BAGS You will be given two drainage bags to take home. One is a large overnight drainage bag, and the other is a smaller leg bag that fits underneath clothing. You may wear the overnight bag at any time, but you should never wear the smaller leg bag at night. Follow the instructions below for how to empty, change, and clean your drainage bags. Emptying the Drainage Bag You must empty your drainage bag when it is  - full or at least 2-3 times a day. Wash your hands with soap and water. Keep the drainage bag below your hips, below the level of your bladder. This stops urine from going back into the tubing and into your bladder. Hold the dirty bag over the toilet or a clean container. Open the pour spout at the bottom of the bag and empty the urine into the toilet or container. Do not let the pour spout touch the toilet, container, or any other surface. Doing so can place bacteria on the bag, which can cause an infection. Clean the pour spout with a gauze pad or cotton ball that has rubbing alcohol on it. Close the pour spout. Attach the bag to your leg with adhesive tape or a leg strap. Wash your hands well. Changing the Drainage Bag Change your drainage bag once a month or sooner if it starts to smell bad or look dirty. Below are steps to follow when changing the drainage bag. Wash your hands with soap and water. Pinch off the rubber catheter so that urine does not spill out. Disconnect the catheter tube from the drainage tube at the connection valve. Do not let the tubes touch any surface. Clean the end of the catheter tube with an alcohol wipe. Use a different alcohol wipe to clean the end of the drainage tube. Connect the catheter tube to the drainage tube of the clean drainage bag. Attach the  new bag to the leg with adhesive tape or a leg strap. Avoid attaching the new bag too tightly. Wash your hands well. Cleaning the Drainage Bag Wash your hands with soap and water. Wash the bag in warm, soapy water. Rinse the bag thoroughly with warm water. Fill the bag with a solution of white vinegar and water (1 cup vinegar to 1 qt warm water [.2 L vinegar to 1 L warm water]).  Close the bag and soak it for 30 minutes in the solution. Rinse the bag with warm water. Hang the bag to dry with the pour spout open and hanging downward. Store the clean bag (once it is dry) in a clean plastic bag. Wash your hands well. PREVENTING INFECTION Wash your hands before and after handling your catheter. Take showers daily and wash the area where the catheter enters your body. Do not take baths. Replace wet leg straps with dry ones, if this applies. Do not use powders, sprays, or lotions on the genital area. Only use creams, lotions, or ointments as directed by your caregiver. For females, wipe from front to back after each bowel movement. Drink enough fluids to keep your urine clear or pale yellow unless you have a fluid restriction. Do not let the drainage bag or tubing touch or lie on the floor. Wear cotton underwear to absorb moisture and to keep your skin drier. SEEK MEDICAL CARE IF:  Your urine is cloudy or smells unusually bad. Your catheter becomes clogged. You are not draining urine into the bag or your bladder feels full. Your catheter starts to leak. SEEK IMMEDIATE MEDICAL CARE IF:  You have pain, swelling, redness, or pus where the catheter enters the body. You have pain in the abdomen, legs, lower back, or bladder. You have a fever. You see blood fill the catheter, or your urine is pink or red. You have nausea, vomiting, or chills. Your catheter gets pulled out. MAKE SURE YOU:  Understand these instructions. Will watch your condition. Will get help right away if you are not doing well  or get worse.   This information is not intended to replace advice given to you by your health care provider. Make sure you discuss any questions you have with your health care provider.   Document Released: 07/01/2005 Document Revised: 11/15/2013 Document Reviewed: 06/22/2012 Elsevier Interactive Patient Education [employer].    Transurethral resection is considered the best way to treat bladder tumors that are not too far along. For most people, the treatment is successful. Sometimes, though, more treatment is needed.  Bladder cancers can come back even after a successful procedure. Because of this, be sure to have a checkup with your caregiver every 3 to 6 months. If everything is OK for 3 years, you can reduce the checkups to once a year.   This information is not intended to replace advice given to you by your health care provider. Make sure you discuss any questions you have with your health care provider.   Document Released: 04/27/2009 Document Revised: 09/23/2011 Document Reviewed: 07/03/2009 Elsevier Interactive Patient Education [employer].

## 2015-07-26 ENCOUNTER — Encounter (HOSPITAL_BASED_OUTPATIENT_CLINIC_OR_DEPARTMENT_OTHER): Payer: Self-pay | Admitting: Urology

## 2015-09-08 DIAGNOSIS — Z5111 Encounter for antineoplastic chemotherapy: Secondary | ICD-10-CM | POA: Diagnosis not present

## 2015-09-08 DIAGNOSIS — C672 Malignant neoplasm of lateral wall of bladder: Secondary | ICD-10-CM | POA: Diagnosis not present

## 2015-09-15 DIAGNOSIS — Z5111 Encounter for antineoplastic chemotherapy: Secondary | ICD-10-CM | POA: Diagnosis not present

## 2015-09-15 DIAGNOSIS — C672 Malignant neoplasm of lateral wall of bladder: Secondary | ICD-10-CM | POA: Diagnosis not present

## 2015-09-22 DIAGNOSIS — C672 Malignant neoplasm of lateral wall of bladder: Secondary | ICD-10-CM | POA: Diagnosis not present

## 2015-09-29 DIAGNOSIS — Z0000 Encounter for general adult medical examination without abnormal findings: Secondary | ICD-10-CM | POA: Diagnosis not present

## 2015-09-29 DIAGNOSIS — C672 Malignant neoplasm of lateral wall of bladder: Secondary | ICD-10-CM | POA: Diagnosis not present

## 2015-09-29 DIAGNOSIS — Z5111 Encounter for antineoplastic chemotherapy: Secondary | ICD-10-CM | POA: Diagnosis not present

## 2015-10-06 DIAGNOSIS — C672 Malignant neoplasm of lateral wall of bladder: Secondary | ICD-10-CM | POA: Diagnosis not present

## 2015-10-06 DIAGNOSIS — Z0000 Encounter for general adult medical examination without abnormal findings: Secondary | ICD-10-CM | POA: Diagnosis not present

## 2015-10-13 DIAGNOSIS — Z5111 Encounter for antineoplastic chemotherapy: Secondary | ICD-10-CM | POA: Diagnosis not present

## 2015-10-13 DIAGNOSIS — Z0000 Encounter for general adult medical examination without abnormal findings: Secondary | ICD-10-CM | POA: Diagnosis not present

## 2015-10-13 DIAGNOSIS — C672 Malignant neoplasm of lateral wall of bladder: Secondary | ICD-10-CM | POA: Diagnosis not present

## 2015-10-16 ENCOUNTER — Other Ambulatory Visit: Payer: Self-pay

## 2015-10-16 DIAGNOSIS — Z1231 Encounter for screening mammogram for malignant neoplasm of breast: Secondary | ICD-10-CM

## 2015-10-23 DIAGNOSIS — C50911 Malignant neoplasm of unspecified site of right female breast: Secondary | ICD-10-CM | POA: Diagnosis not present

## 2015-11-03 ENCOUNTER — Ambulatory Visit
Admission: RE | Admit: 2015-11-03 | Discharge: 2015-11-03 | Disposition: A | Discharge status: Home / Self Care | Payer: Medicare Other | Source: Ambulatory Visit

## 2015-11-03 DIAGNOSIS — Z1231 Encounter for screening mammogram for malignant neoplasm of breast: Secondary | ICD-10-CM

## 2015-11-16 DIAGNOSIS — H353122 Nonexudative age-related macular degeneration, left eye, intermediate dry stage: Secondary | ICD-10-CM | POA: Diagnosis not present

## 2015-11-16 DIAGNOSIS — H353112 Nonexudative age-related macular degeneration, right eye, intermediate dry stage: Secondary | ICD-10-CM | POA: Diagnosis not present

## 2015-11-16 DIAGNOSIS — H43812 Vitreous degeneration, left eye: Secondary | ICD-10-CM | POA: Diagnosis not present

## 2015-11-16 DIAGNOSIS — H35341 Macular cyst, hole, or pseudohole, right eye: Secondary | ICD-10-CM | POA: Diagnosis not present

## 2015-11-17 DIAGNOSIS — C50911 Malignant neoplasm of unspecified site of right female breast: Secondary | ICD-10-CM | POA: Diagnosis not present

## 2016-01-08 DIAGNOSIS — C670 Malignant neoplasm of trigone of bladder: Secondary | ICD-10-CM | POA: Diagnosis not present

## 2016-04-09 DIAGNOSIS — N390 Urinary tract infection, site not specified: Secondary | ICD-10-CM | POA: Diagnosis not present

## 2016-04-09 DIAGNOSIS — R309 Painful micturition, unspecified: Secondary | ICD-10-CM | POA: Diagnosis not present

## 2016-04-16 DIAGNOSIS — C670 Malignant neoplasm of trigone of bladder: Secondary | ICD-10-CM | POA: Diagnosis not present

## 2016-04-23 DIAGNOSIS — Z5111 Encounter for antineoplastic chemotherapy: Secondary | ICD-10-CM | POA: Diagnosis not present

## 2016-04-23 DIAGNOSIS — C672 Malignant neoplasm of lateral wall of bladder: Secondary | ICD-10-CM | POA: Diagnosis not present

## 2016-04-30 DIAGNOSIS — C670 Malignant neoplasm of trigone of bladder: Secondary | ICD-10-CM | POA: Diagnosis not present

## 2016-04-30 DIAGNOSIS — C672 Malignant neoplasm of lateral wall of bladder: Secondary | ICD-10-CM | POA: Diagnosis not present

## 2016-04-30 DIAGNOSIS — Z5111 Encounter for antineoplastic chemotherapy: Secondary | ICD-10-CM | POA: Diagnosis not present

## 2016-05-07 DIAGNOSIS — C670 Malignant neoplasm of trigone of bladder: Secondary | ICD-10-CM | POA: Diagnosis not present

## 2016-05-07 DIAGNOSIS — Z5111 Encounter for antineoplastic chemotherapy: Secondary | ICD-10-CM | POA: Diagnosis not present

## 2016-05-07 DIAGNOSIS — C679 Malignant neoplasm of bladder, unspecified: Secondary | ICD-10-CM | POA: Diagnosis not present

## 2016-06-12 DIAGNOSIS — R05 Cough: Secondary | ICD-10-CM | POA: Diagnosis not present

## 2016-06-12 DIAGNOSIS — J0110 Acute frontal sinusitis, unspecified: Secondary | ICD-10-CM | POA: Diagnosis not present

## 2016-08-19 DIAGNOSIS — J111 Influenza due to unidentified influenza virus with other respiratory manifestations: Secondary | ICD-10-CM | POA: Diagnosis not present

## 2016-08-19 DIAGNOSIS — R6889 Other general symptoms and signs: Secondary | ICD-10-CM | POA: Diagnosis not present

## 2016-09-16 DIAGNOSIS — C670 Malignant neoplasm of trigone of bladder: Secondary | ICD-10-CM | POA: Diagnosis not present

## 2016-10-03 ENCOUNTER — Other Ambulatory Visit: Payer: Self-pay | Admitting: Family Medicine

## 2016-10-03 DIAGNOSIS — Z1231 Encounter for screening mammogram for malignant neoplasm of breast: Secondary | ICD-10-CM

## 2016-10-03 DIAGNOSIS — Z853 Personal history of malignant neoplasm of breast: Secondary | ICD-10-CM

## 2016-11-04 ENCOUNTER — Ambulatory Visit
Admission: RE | Admit: 2016-11-04 | Discharge: 2016-11-04 | Disposition: A | Discharge status: Home / Self Care | Payer: Medicare Other | Source: Ambulatory Visit | Attending: Family Medicine | Admitting: Family Medicine

## 2016-11-04 DIAGNOSIS — Z1231 Encounter for screening mammogram for malignant neoplasm of breast: Secondary | ICD-10-CM | POA: Diagnosis not present

## 2016-11-04 DIAGNOSIS — Z853 Personal history of malignant neoplasm of breast: Secondary | ICD-10-CM

## 2016-11-04 HISTORY — DX: Personal history of antineoplastic chemotherapy: Z92.21

## 2016-11-04 HISTORY — DX: Malignant neoplasm of unspecified site of unspecified female breast: C50.919

## 2016-11-04 HISTORY — DX: Malignant (primary) neoplasm, unspecified: C80.1

## 2016-11-04 HISTORY — DX: Personal history of irradiation: Z92.3

## 2016-11-06 DIAGNOSIS — C50911 Malignant neoplasm of unspecified site of right female breast: Secondary | ICD-10-CM | POA: Diagnosis not present

## 2016-11-21 DIAGNOSIS — C50911 Malignant neoplasm of unspecified site of right female breast: Secondary | ICD-10-CM | POA: Diagnosis not present

## 2016-12-03 IMAGING — MG MM DIGITAL SCREENING BILAT
4 series · 4 of 4 positions shown · non-contrast
Comparison: Previous exam(s).

CLINICAL DATA: Screening.

EXAM:
DIGITAL SCREENING BILATERAL MAMMOGRAM WITH CAD

[L MLO]
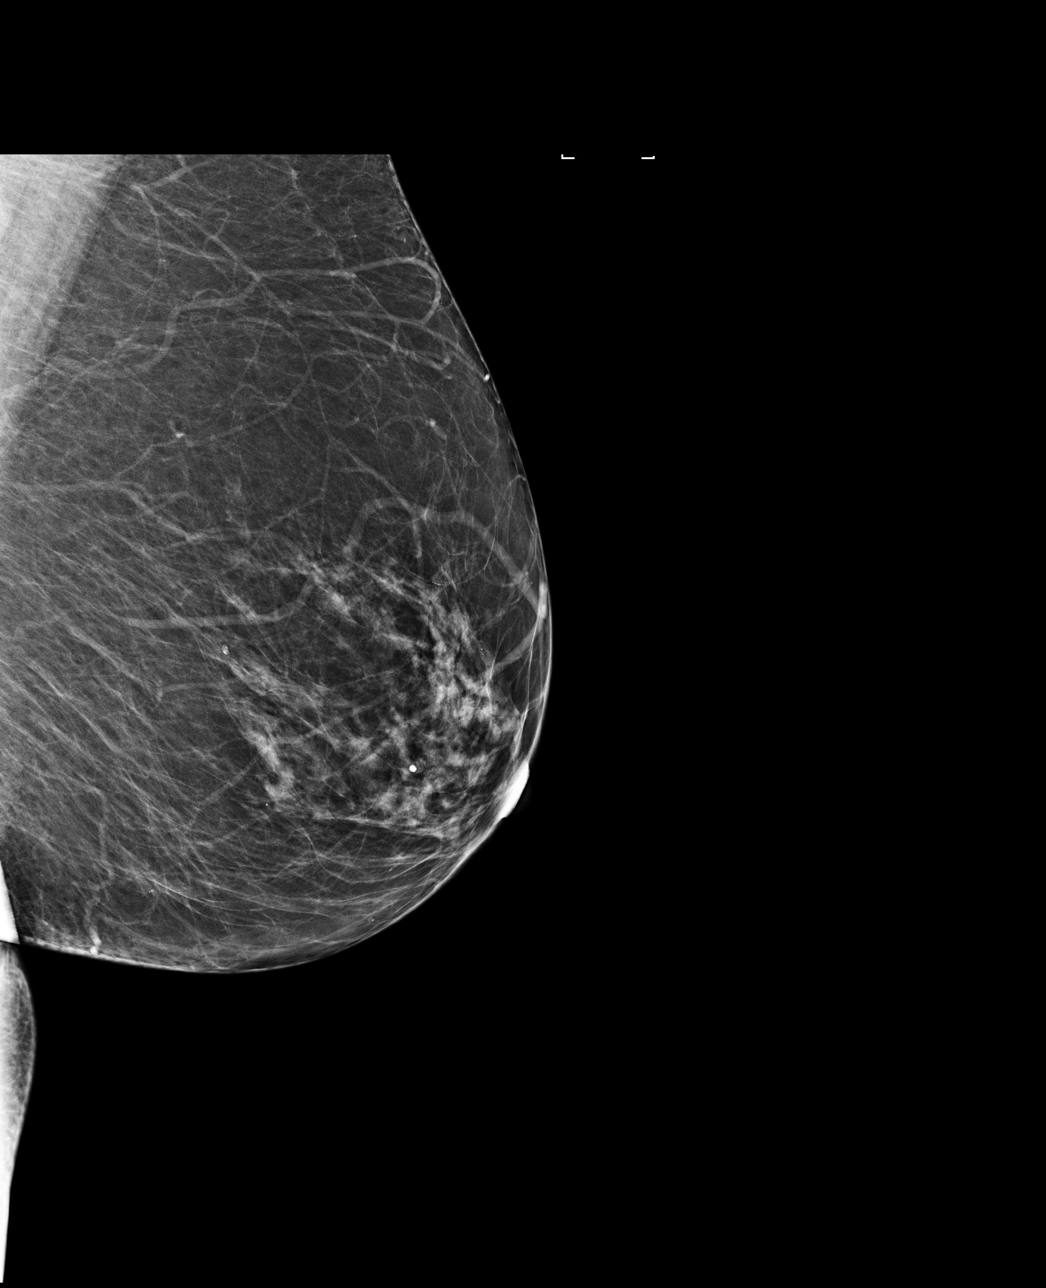

[R MLO]
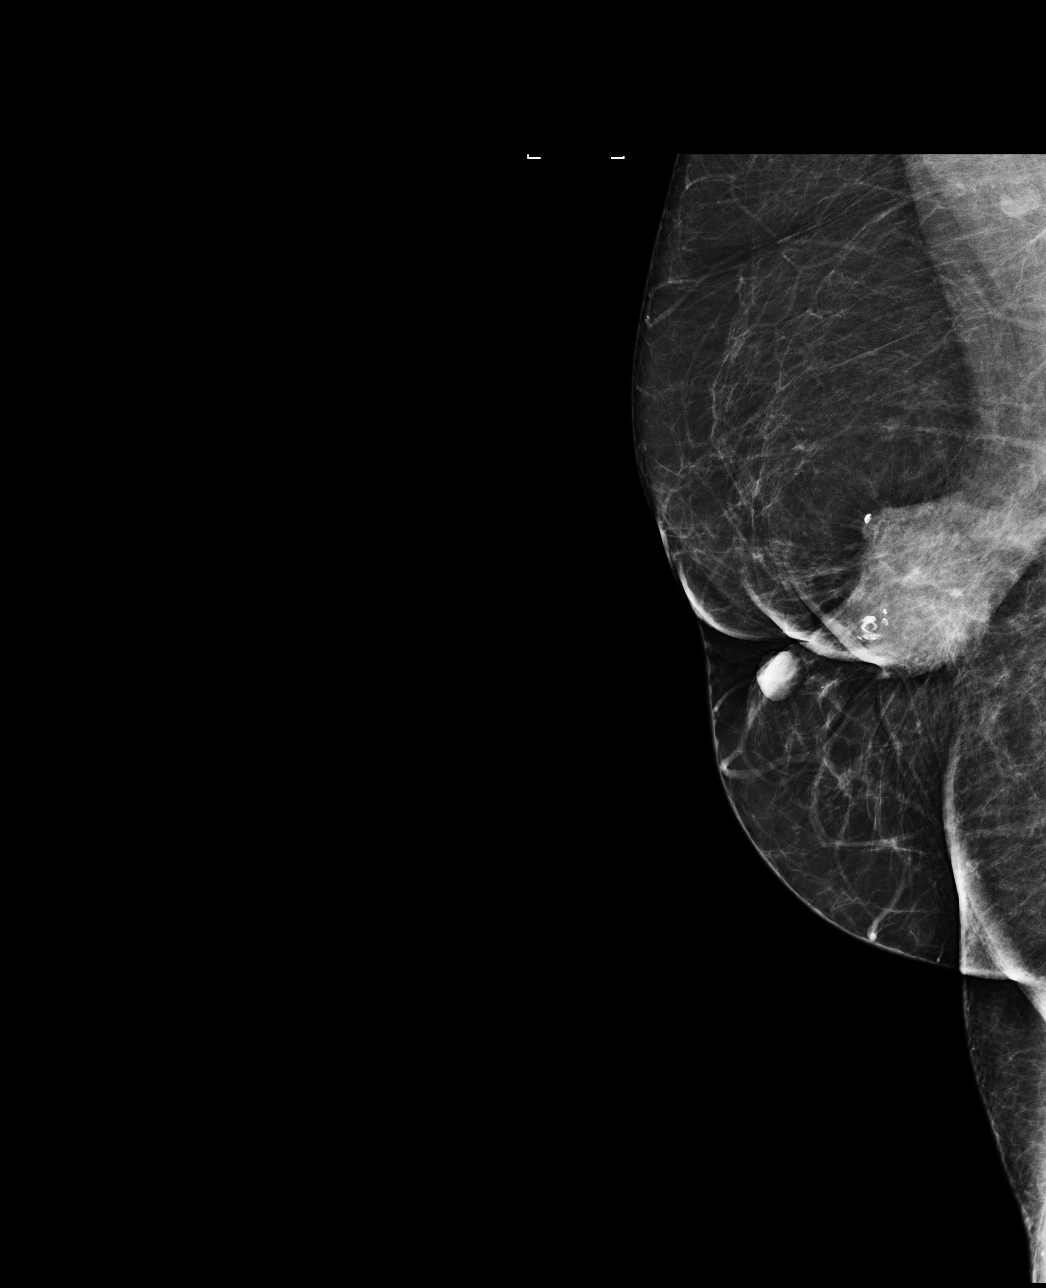

[L CC]
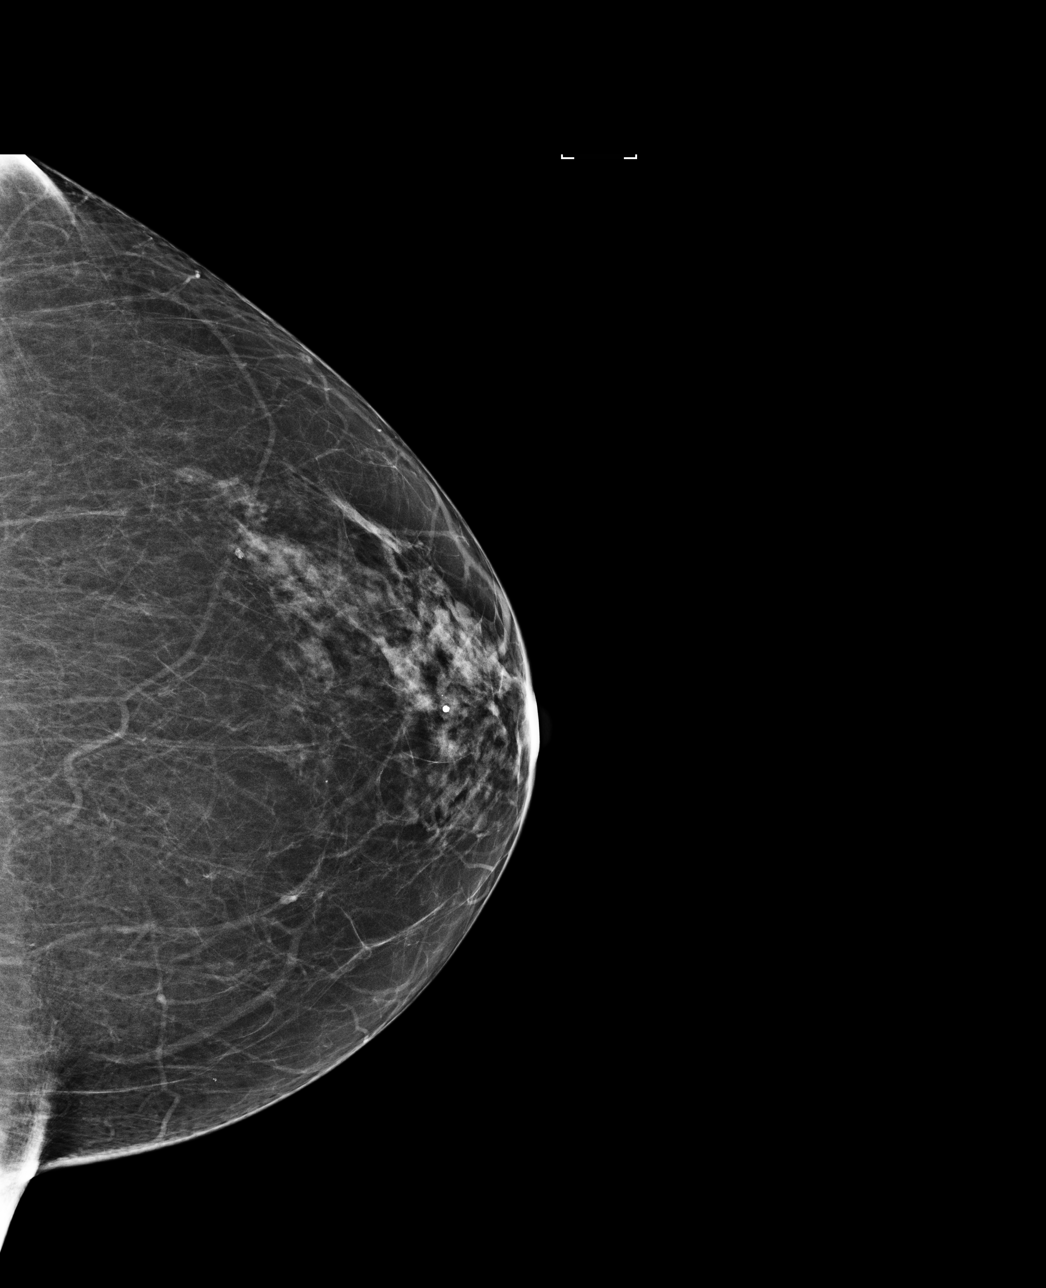

[R CC]
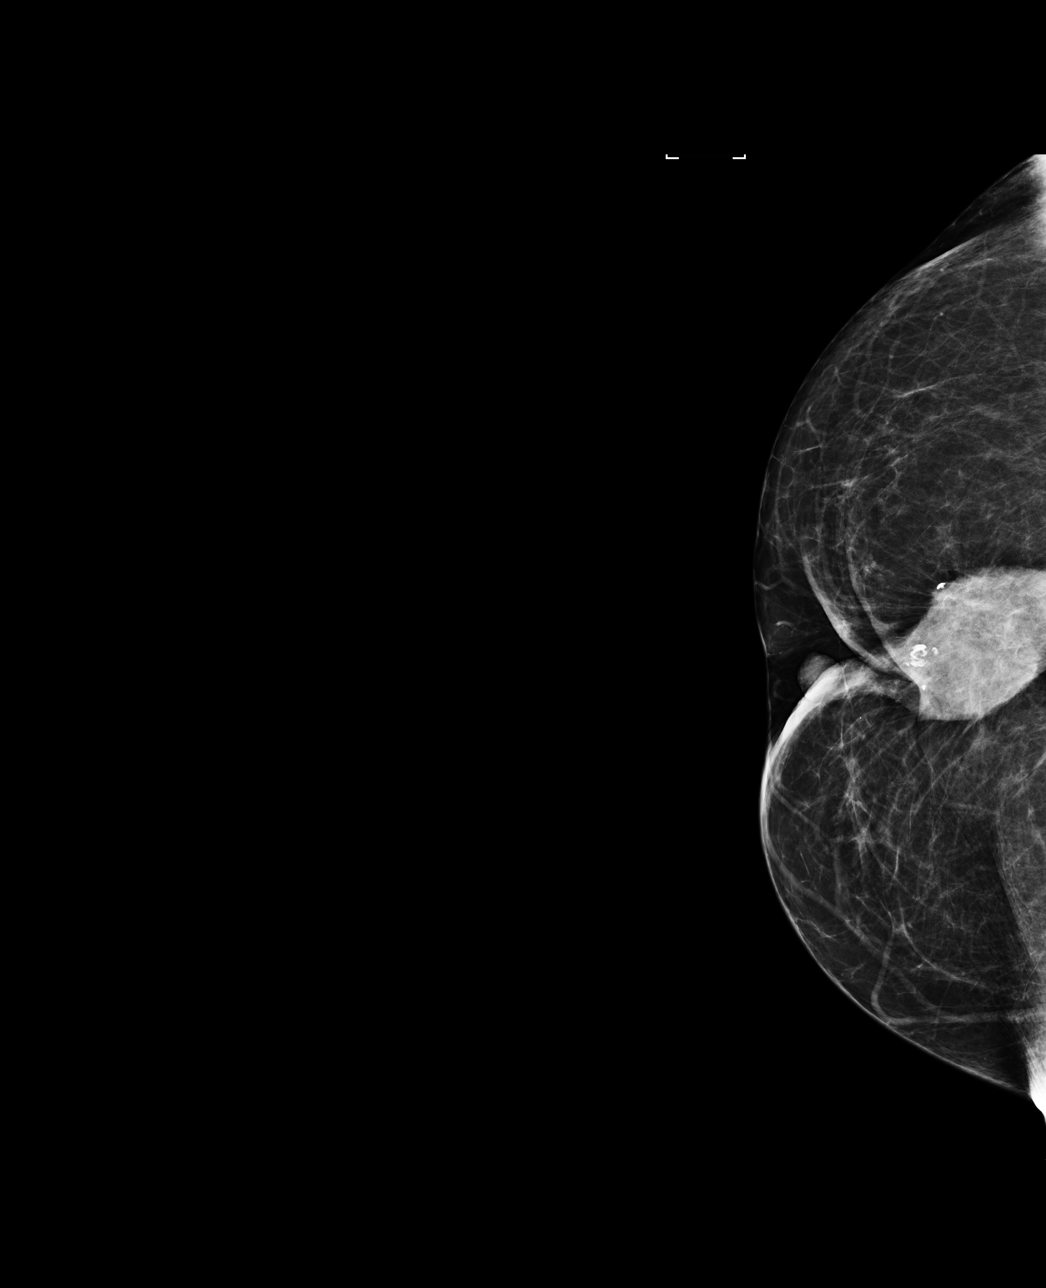

[4 of 4 positions shown; findings below may reference images not displayed]

ACR Breast Density Category b: There are scattered areas of
fibroglandular density.
FINDINGS: There are no findings suspicious for malignancy. Stable postsurgical
changes on the right. Images were processed with CAD.
IMPRESSION: No mammographic evidence of malignancy. A result letter of this
screening mammogram will be mailed directly to the patient.

RECOMMENDATION:
Screening mammogram in one year. (Code:CF-5-2BQ)

BI-RADS CATEGORY  2: Benign.

## 2017-01-06 DIAGNOSIS — C670 Malignant neoplasm of trigone of bladder: Secondary | ICD-10-CM | POA: Diagnosis not present

## 2017-01-28 DIAGNOSIS — Z5111 Encounter for antineoplastic chemotherapy: Secondary | ICD-10-CM | POA: Diagnosis not present

## 2017-01-28 DIAGNOSIS — C672 Malignant neoplasm of lateral wall of bladder: Secondary | ICD-10-CM | POA: Diagnosis not present

## 2017-02-04 DIAGNOSIS — C672 Malignant neoplasm of lateral wall of bladder: Secondary | ICD-10-CM | POA: Diagnosis not present

## 2017-02-04 DIAGNOSIS — Z5111 Encounter for antineoplastic chemotherapy: Secondary | ICD-10-CM | POA: Diagnosis not present

## 2017-02-11 DIAGNOSIS — Z5111 Encounter for antineoplastic chemotherapy: Secondary | ICD-10-CM | POA: Diagnosis not present

## 2017-02-11 DIAGNOSIS — C672 Malignant neoplasm of lateral wall of bladder: Secondary | ICD-10-CM | POA: Diagnosis not present

## 2017-06-10 DIAGNOSIS — C670 Malignant neoplasm of trigone of bladder: Secondary | ICD-10-CM | POA: Diagnosis not present

## 2017-08-26 ENCOUNTER — Ambulatory Visit
Admission: RE | Admit: 2017-08-26 | Discharge: 2017-08-26 | Disposition: A | Discharge status: Home / Self Care | Payer: Medicare Other | Source: Ambulatory Visit | Attending: Family Medicine | Admitting: Family Medicine

## 2017-08-26 ENCOUNTER — Other Ambulatory Visit: Payer: Self-pay | Admitting: Family Medicine

## 2017-08-26 DIAGNOSIS — K219 Gastro-esophageal reflux disease without esophagitis: Secondary | ICD-10-CM | POA: Diagnosis not present

## 2017-08-26 DIAGNOSIS — M25512 Pain in left shoulder: Secondary | ICD-10-CM

## 2017-08-26 DIAGNOSIS — G479 Sleep disorder, unspecified: Secondary | ICD-10-CM | POA: Diagnosis not present

## 2017-08-26 DIAGNOSIS — S4992XA Unspecified injury of left shoulder and upper arm, initial encounter: Secondary | ICD-10-CM | POA: Diagnosis not present

## 2017-09-24 DIAGNOSIS — C670 Malignant neoplasm of trigone of bladder: Secondary | ICD-10-CM | POA: Diagnosis not present

## 2017-09-29 ENCOUNTER — Other Ambulatory Visit: Payer: Self-pay | Admitting: Family Medicine

## 2017-09-29 DIAGNOSIS — Z1231 Encounter for screening mammogram for malignant neoplasm of breast: Secondary | ICD-10-CM

## 2017-11-12 ENCOUNTER — Ambulatory Visit
Admission: RE | Admit: 2017-11-12 | Discharge: 2017-11-12 | Disposition: A | Discharge status: Home / Self Care | Payer: Medicare Other | Source: Ambulatory Visit | Attending: Family Medicine | Admitting: Family Medicine

## 2017-11-12 ENCOUNTER — Other Ambulatory Visit: Payer: Self-pay | Admitting: Family Medicine

## 2017-11-12 DIAGNOSIS — Z1231 Encounter for screening mammogram for malignant neoplasm of breast: Secondary | ICD-10-CM | POA: Diagnosis not present

## 2017-11-13 ENCOUNTER — Other Ambulatory Visit: Payer: Self-pay | Admitting: Family Medicine

## 2017-11-13 DIAGNOSIS — R928 Other abnormal and inconclusive findings on diagnostic imaging of breast: Secondary | ICD-10-CM

## 2017-11-18 ENCOUNTER — Ambulatory Visit
Admission: RE | Admit: 2017-11-18 | Discharge: 2017-11-18 | Disposition: A | Discharge status: Home / Self Care | Payer: Medicare Other | Source: Ambulatory Visit | Attending: Family Medicine | Admitting: Family Medicine

## 2017-11-18 ENCOUNTER — Other Ambulatory Visit: Payer: Self-pay | Admitting: Family Medicine

## 2017-11-18 DIAGNOSIS — R921 Mammographic calcification found on diagnostic imaging of breast: Secondary | ICD-10-CM | POA: Diagnosis not present

## 2017-11-18 DIAGNOSIS — R928 Other abnormal and inconclusive findings on diagnostic imaging of breast: Secondary | ICD-10-CM

## 2017-12-10 DIAGNOSIS — C50911 Malignant neoplasm of unspecified site of right female breast: Secondary | ICD-10-CM | POA: Diagnosis not present

## 2017-12-23 DIAGNOSIS — H526 Other disorders of refraction: Secondary | ICD-10-CM | POA: Diagnosis not present

## 2017-12-31 DIAGNOSIS — C50911 Malignant neoplasm of unspecified site of right female breast: Secondary | ICD-10-CM | POA: Diagnosis not present

## 2018-01-07 DIAGNOSIS — H26491 Other secondary cataract, right eye: Secondary | ICD-10-CM | POA: Diagnosis not present

## 2018-01-16 DIAGNOSIS — M25511 Pain in right shoulder: Secondary | ICD-10-CM | POA: Diagnosis not present

## 2018-01-16 DIAGNOSIS — M542 Cervicalgia: Secondary | ICD-10-CM | POA: Diagnosis not present

## 2018-01-16 DIAGNOSIS — M791 Myalgia, unspecified site: Secondary | ICD-10-CM | POA: Diagnosis not present

## 2018-01-16 DIAGNOSIS — M549 Dorsalgia, unspecified: Secondary | ICD-10-CM | POA: Diagnosis not present

## 2018-05-08 DIAGNOSIS — Z6829 Body mass index (BMI) 29.0-29.9, adult: Secondary | ICD-10-CM | POA: Diagnosis not present

## 2018-05-08 DIAGNOSIS — M549 Dorsalgia, unspecified: Secondary | ICD-10-CM | POA: Diagnosis not present

## 2018-05-08 DIAGNOSIS — M419 Scoliosis, unspecified: Secondary | ICD-10-CM | POA: Diagnosis not present

## 2018-06-16 ENCOUNTER — Other Ambulatory Visit: Payer: Self-pay | Admitting: Family Medicine

## 2018-06-16 ENCOUNTER — Ambulatory Visit
Admission: RE | Admit: 2018-06-16 | Discharge: 2018-06-16 | Disposition: A | Discharge status: Home / Self Care | Payer: Medicare Other | Source: Ambulatory Visit | Attending: Family Medicine | Admitting: Family Medicine

## 2018-06-16 DIAGNOSIS — R921 Mammographic calcification found on diagnostic imaging of breast: Secondary | ICD-10-CM

## 2018-07-02 DIAGNOSIS — C672 Malignant neoplasm of lateral wall of bladder: Secondary | ICD-10-CM | POA: Diagnosis not present

## 2018-07-21 DIAGNOSIS — Z0120 Encounter for dental examination and cleaning without abnormal findings: Secondary | ICD-10-CM | POA: Diagnosis not present

## 2018-12-29 DIAGNOSIS — C50911 Malignant neoplasm of unspecified site of right female breast: Secondary | ICD-10-CM | POA: Diagnosis not present

## 2019-01-05 ENCOUNTER — Ambulatory Visit
Admission: RE | Admit: 2019-01-05 | Discharge: 2019-01-05 | Disposition: A | Discharge status: Home / Self Care | Payer: Medicare Other | Source: Ambulatory Visit | Attending: Family Medicine | Admitting: Family Medicine

## 2019-01-05 ENCOUNTER — Other Ambulatory Visit: Payer: Self-pay

## 2019-01-05 DIAGNOSIS — R921 Mammographic calcification found on diagnostic imaging of breast: Secondary | ICD-10-CM

## 2019-01-28 DIAGNOSIS — C672 Malignant neoplasm of lateral wall of bladder: Secondary | ICD-10-CM | POA: Diagnosis not present

## 2019-04-13 DIAGNOSIS — K219 Gastro-esophageal reflux disease without esophagitis: Secondary | ICD-10-CM | POA: Diagnosis not present

## 2019-04-13 DIAGNOSIS — R234 Changes in skin texture: Secondary | ICD-10-CM | POA: Diagnosis not present

## 2019-04-16 ENCOUNTER — Other Ambulatory Visit: Payer: Self-pay | Admitting: Family Medicine

## 2019-04-16 DIAGNOSIS — R234 Changes in skin texture: Secondary | ICD-10-CM

## 2019-04-27 ENCOUNTER — Other Ambulatory Visit: Payer: Self-pay

## 2019-04-27 ENCOUNTER — Ambulatory Visit
Admission: RE | Admit: 2019-04-27 | Discharge: 2019-04-27 | Disposition: A | Discharge status: Home / Self Care | Payer: Medicare Other | Source: Ambulatory Visit | Attending: Family Medicine | Admitting: Family Medicine

## 2019-04-27 ENCOUNTER — Ambulatory Visit: Payer: Medicare Other

## 2019-04-27 DIAGNOSIS — R234 Changes in skin texture: Secondary | ICD-10-CM

## 2019-04-27 DIAGNOSIS — R928 Other abnormal and inconclusive findings on diagnostic imaging of breast: Secondary | ICD-10-CM | POA: Diagnosis not present

## 2019-07-22 DIAGNOSIS — Z8551 Personal history of malignant neoplasm of bladder: Secondary | ICD-10-CM | POA: Diagnosis not present

## 2019-09-13 ENCOUNTER — Ambulatory Visit: Payer: Medicare Other | Attending: Internal Medicine

## 2019-09-13 DIAGNOSIS — Z23 Encounter for immunization: Secondary | ICD-10-CM

## 2019-09-13 NOTE — Progress Notes (Signed)
   Covid-19 Vaccination Clinic  Name:  Whitney Boyd    MRN: [id number] DOB: Nov 10, 1945  09/13/2019  [name] was observed post Covid-19 immunization for 15 minutes without incidence. She was provided with Vaccine Information Sheet and instruction to access the V-Safe system.   [name] was instructed to call 911 with any severe reactions post vaccine: Marland Kitchen Difficulty breathing  . Swelling of your face and throat  . A fast heartbeat  . A bad rash all over your body  . Dizziness and weakness    Immunizations Administered    Name Date Dose VIS Date Route   Pfizer COVID-19 Vaccine 09/13/2019  9:12 AM 0.3 mL 06/25/2019 Intramuscular   Manufacturer: [place]   Lot: [id number]   [place]: [id number]

## 2019-10-12 ENCOUNTER — Ambulatory Visit: Payer: Medicare Other | Attending: Internal Medicine

## 2019-10-12 DIAGNOSIS — Z23 Encounter for immunization: Secondary | ICD-10-CM

## 2019-10-12 NOTE — Progress Notes (Signed)
   Covid-19 Vaccination Clinic  Name:  JOHNETTA EGERER    MRN: [id number] DOB: [birth year]  10/12/2019  Ms. Duffy was observed post Covid-19 immunization for 15 minutes without incident. She was provided with Vaccine Information Sheet and instruction to access the V-Safe system.   Ms. Longden was instructed to call 911 with any severe reactions post vaccine: Marland Kitchen Difficulty breathing  . Swelling of face and throat  . A fast heartbeat  . A bad rash all over body  . Dizziness and weakness   Immunizations Administered    Name Date Dose VIS Date Route   Pfizer COVID-19 Vaccine 10/12/2019  8:55 AM 0.3 mL 06/25/2019 Intramuscular   Manufacturer: [place]   Lot: [id number]   [place]: [id number]

## 2019-11-02 DIAGNOSIS — C50911 Malignant neoplasm of unspecified site of right female breast: Secondary | ICD-10-CM | POA: Diagnosis not present

## 2019-11-29 ENCOUNTER — Other Ambulatory Visit: Payer: Self-pay | Admitting: Family Medicine

## 2019-11-29 DIAGNOSIS — H43813 Vitreous degeneration, bilateral: Secondary | ICD-10-CM | POA: Diagnosis not present

## 2019-11-29 DIAGNOSIS — R921 Mammographic calcification found on diagnostic imaging of breast: Secondary | ICD-10-CM

## 2019-11-29 DIAGNOSIS — H18593 Other hereditary corneal dystrophies, bilateral: Secondary | ICD-10-CM | POA: Diagnosis not present

## 2019-12-27 DIAGNOSIS — H18593 Other hereditary corneal dystrophies, bilateral: Secondary | ICD-10-CM | POA: Diagnosis not present

## 2020-02-01 ENCOUNTER — Other Ambulatory Visit: Payer: Self-pay

## 2020-02-01 ENCOUNTER — Ambulatory Visit
Admission: RE | Admit: 2020-02-01 | Discharge: 2020-02-01 | Disposition: A | Discharge status: Home / Self Care | Payer: Medicare Other | Source: Ambulatory Visit | Attending: Family Medicine | Admitting: Family Medicine

## 2020-02-01 DIAGNOSIS — R921 Mammographic calcification found on diagnostic imaging of breast: Secondary | ICD-10-CM

## 2020-02-02 ENCOUNTER — Telehealth: Payer: Self-pay | Admitting: Family Medicine

## 2020-02-02 NOTE — Telephone Encounter (Signed)
Unfortunately at this time I am not able to take on a new patient (due to patient volume)

## 2020-02-02 NOTE — Telephone Encounter (Signed)
Please advise 

## 2020-02-02 NOTE — Telephone Encounter (Signed)
Mrs. Matarazzo would like to know if she could establish care with Dr. Birdie Riddle.  States that her daughter Maura Crandall and granddaughters are current patients of Dr. Birdie Riddle and would like to be establish care as well.

## 2020-02-03 NOTE — Telephone Encounter (Signed)
I have advised patient.  She may call back to establish care with Murrell Redden at [facility].

## 2020-09-15 ENCOUNTER — Other Ambulatory Visit: Payer: Self-pay | Admitting: Family Medicine

## 2020-09-15 ENCOUNTER — Ambulatory Visit (HOSPITAL_COMMUNITY)
Admission: RE | Admit: 2020-09-15 | Discharge: 2020-09-15 | Disposition: A | Discharge status: Home / Self Care | Payer: Medicare Other | Source: Ambulatory Visit | Attending: Family Medicine | Admitting: Family Medicine

## 2020-09-15 ENCOUNTER — Other Ambulatory Visit (HOSPITAL_COMMUNITY): Payer: Self-pay | Admitting: Family Medicine

## 2020-09-15 ENCOUNTER — Other Ambulatory Visit: Payer: Self-pay

## 2020-09-15 DIAGNOSIS — M7989 Other specified soft tissue disorders: Secondary | ICD-10-CM

## 2020-09-15 DIAGNOSIS — M79662 Pain in left lower leg: Secondary | ICD-10-CM

## 2020-12-25 ENCOUNTER — Other Ambulatory Visit: Payer: Self-pay | Admitting: Family Medicine

## 2020-12-25 DIAGNOSIS — Z1231 Encounter for screening mammogram for malignant neoplasm of breast: Secondary | ICD-10-CM

## 2021-02-16 ENCOUNTER — Ambulatory Visit
Admission: RE | Admit: 2021-02-16 | Discharge: 2021-02-16 | Disposition: A | Discharge status: Home / Self Care | Payer: Medicare Other | Source: Ambulatory Visit | Attending: Family Medicine | Admitting: Family Medicine

## 2021-02-16 ENCOUNTER — Other Ambulatory Visit: Payer: Self-pay

## 2021-02-16 DIAGNOSIS — Z1231 Encounter for screening mammogram for malignant neoplasm of breast: Secondary | ICD-10-CM

## 2021-05-30 ENCOUNTER — Other Ambulatory Visit: Payer: Self-pay | Admitting: Family Medicine

## 2021-05-30 DIAGNOSIS — S0990XA Unspecified injury of head, initial encounter: Secondary | ICD-10-CM

## 2021-05-30 DIAGNOSIS — R42 Dizziness and giddiness: Secondary | ICD-10-CM

## 2021-05-31 ENCOUNTER — Other Ambulatory Visit: Payer: Self-pay

## 2021-05-31 ENCOUNTER — Ambulatory Visit (HOSPITAL_COMMUNITY)
Admission: RE | Admit: 2021-05-31 | Discharge: 2021-05-31 | Disposition: A | Discharge status: Home / Self Care | Payer: Medicare Other | Source: Ambulatory Visit | Attending: Family Medicine | Admitting: Family Medicine

## 2021-05-31 DIAGNOSIS — S0990XA Unspecified injury of head, initial encounter: Secondary | ICD-10-CM | POA: Diagnosis present

## 2021-05-31 DIAGNOSIS — R42 Dizziness and giddiness: Secondary | ICD-10-CM | POA: Insufficient documentation

## 2022-01-16 ENCOUNTER — Other Ambulatory Visit: Payer: Self-pay | Admitting: Family Medicine

## 2022-01-16 DIAGNOSIS — Z1231 Encounter for screening mammogram for malignant neoplasm of breast: Secondary | ICD-10-CM

## 2022-02-26 ENCOUNTER — Ambulatory Visit: Payer: Medicare Other

## 2022-02-28 ENCOUNTER — Ambulatory Visit
Admission: RE | Admit: 2022-02-28 | Discharge: 2022-02-28 | Disposition: A | Discharge status: Home / Self Care | Payer: Medicare Other | Source: Ambulatory Visit | Attending: Family Medicine | Admitting: Family Medicine

## 2022-02-28 DIAGNOSIS — Z1231 Encounter for screening mammogram for malignant neoplasm of breast: Secondary | ICD-10-CM

## 2022-06-17 ENCOUNTER — Other Ambulatory Visit: Payer: Self-pay | Admitting: Urology

## 2022-06-21 ENCOUNTER — Encounter (HOSPITAL_BASED_OUTPATIENT_CLINIC_OR_DEPARTMENT_OTHER): Payer: Self-pay | Admitting: Urology

## 2022-06-21 NOTE — Progress Notes (Signed)
Spoke w/ via phone for pre-op interview--- pt Lab needs dos----   no            Lab results------ no COVID test -----patient states asymptomatic no test needed Arrive at ------- 0630 on 06-26-2022 NPO after MN NO Solid Food.  Clear liquids from MN until--- 0530 Med rec completed Medications to take morning of surgery ----- protonix Diabetic medication ----- n/a Patient instructed no nail polish to be worn day of surgery Patient instructed to bring photo id and insurance card day of surgery Patient aware to have Driver (ride ) / caregiver for 24 hours after surgery -- daughter, nickol Patient Special Instructions ----- n/a Pre-Op special Istructions ----- n/a Patient verbalized understanding of instructions that were given at this phone interview. Patient denies shortness of breath, chest pain, fever, cough at this phone interview.

## 2022-06-26 ENCOUNTER — Ambulatory Visit (HOSPITAL_BASED_OUTPATIENT_CLINIC_OR_DEPARTMENT_OTHER): Payer: Medicare Other | Admitting: Anesthesiology

## 2022-06-26 ENCOUNTER — Encounter (HOSPITAL_BASED_OUTPATIENT_CLINIC_OR_DEPARTMENT_OTHER): Payer: Self-pay | Admitting: Urology

## 2022-06-26 ENCOUNTER — Encounter (HOSPITAL_BASED_OUTPATIENT_CLINIC_OR_DEPARTMENT_OTHER)
Admission: RE | Disposition: A | Discharge status: Home / Self Care | Payer: Self-pay | Source: Home / Self Care | Attending: Urology

## 2022-06-26 ENCOUNTER — Ambulatory Visit (HOSPITAL_BASED_OUTPATIENT_CLINIC_OR_DEPARTMENT_OTHER)
Admission: RE | Admit: 2022-06-26 | Discharge: 2022-06-26 | Disposition: A | Discharge status: Home / Self Care | Payer: Medicare Other | Attending: Urology | Admitting: Urology

## 2022-06-26 DIAGNOSIS — Z8551 Personal history of malignant neoplasm of bladder: Secondary | ICD-10-CM | POA: Insufficient documentation

## 2022-06-26 DIAGNOSIS — Z01818 Encounter for other preprocedural examination: Secondary | ICD-10-CM

## 2022-06-26 DIAGNOSIS — K219 Gastro-esophageal reflux disease without esophagitis: Secondary | ICD-10-CM | POA: Diagnosis not present

## 2022-06-26 DIAGNOSIS — D090 Carcinoma in situ of bladder: Secondary | ICD-10-CM | POA: Insufficient documentation

## 2022-06-26 DIAGNOSIS — N329 Bladder disorder, unspecified: Secondary | ICD-10-CM

## 2022-06-26 DIAGNOSIS — Z87891 Personal history of nicotine dependence: Secondary | ICD-10-CM | POA: Diagnosis not present

## 2022-06-26 DIAGNOSIS — C50911 Malignant neoplasm of unspecified site of right female breast: Secondary | ICD-10-CM | POA: Diagnosis not present

## 2022-06-26 HISTORY — PX: CYSTOSCOPY W/ URETERAL STENT PLACEMENT: SHX1429

## 2022-06-26 HISTORY — PX: CYSTOSCOPY WITH FULGERATION: SHX6638

## 2022-06-26 HISTORY — DX: Bladder disorder, unspecified: N32.9

## 2022-06-26 HISTORY — DX: Sciatica, right side: M54.31

## 2022-06-26 HISTORY — DX: Sciatica, right side: M54.32

## 2022-06-26 HISTORY — PX: CYSTOSCOPY WITH BIOPSY: SHX5122

## 2022-06-26 HISTORY — DX: Dry eye syndrome of bilateral lacrimal glands: H04.123

## 2022-06-26 HISTORY — DX: Primary insomnia: F51.01

## 2022-06-26 SURGERY — CYSTOSCOPY, WITH BIOPSY
Anesthesia: General | Site: Ureter

## 2022-06-26 MED ORDER — IOHEXOL 300 MG/ML  SOLN
INTRAMUSCULAR | Status: DC | PRN
  Administered 2022-06-26: 15 mL via URETHRAL

## 2022-06-26 MED ORDER — SCOPOLAMINE 1 MG/3DAYS TD PT72
1.0000 | MEDICATED_PATCH | TRANSDERMAL | Status: DC
  Administered 2022-06-26: 1.5 mg via TRANSDERMAL

## 2022-06-26 MED ORDER — FENTANYL CITRATE (PF) 100 MCG/2ML IJ SOLN
INTRAMUSCULAR | Status: AC
  Filled 2022-06-26: qty 2

## 2022-06-26 MED ORDER — PROPOFOL 10 MG/ML IV BOLUS
INTRAVENOUS | Status: DC | PRN
  Administered 2022-06-26: 150 mg via INTRAVENOUS

## 2022-06-26 MED ORDER — CEFAZOLIN SODIUM-DEXTROSE 2-4 GM/100ML-% IV SOLN
2.0000 g | INTRAVENOUS | Status: AC
  Administered 2022-06-26: 2 g via INTRAVENOUS

## 2022-06-26 MED ORDER — PHENAZOPYRIDINE HCL 200 MG PO TABS: 200.0000 mg | ORAL_TABLET | Freq: Three times a day (TID) | ORAL | 0 refills | Status: AC | PRN

## 2022-06-26 MED ORDER — TRAMADOL HCL 50 MG PO TABS: 50.0000 mg | ORAL_TABLET | Freq: Four times a day (QID) | ORAL | 0 refills | Status: AC | PRN

## 2022-06-26 MED ORDER — LACTATED RINGERS IV SOLN
INTRAVENOUS | Status: DC
Start: 2022-06-26 — End: 2022-06-26

## 2022-06-26 MED ORDER — PROPOFOL 500 MG/50ML IV EMUL
INTRAVENOUS | Status: DC | PRN
  Administered 2022-06-26: 150 ug/kg/min via INTRAVENOUS

## 2022-06-26 MED ORDER — OXYBUTYNIN CHLORIDE 5 MG PO TABS: 5.0000 mg | ORAL_TABLET | Freq: Three times a day (TID) | ORAL | 1 refills | Status: DC | PRN

## 2022-06-26 MED ORDER — ACETAMINOPHEN 10 MG/ML IV SOLN: 1000.0000 mg | Freq: Once | INTRAVENOUS | Status: DC | PRN

## 2022-06-26 MED ORDER — SCOPOLAMINE 1 MG/3DAYS TD PT72
MEDICATED_PATCH | TRANSDERMAL | Status: AC
  Filled 2022-06-26: qty 1

## 2022-06-26 MED ORDER — GEMCITABINE CHEMO FOR BLADDER INSTILLATION 2000 MG
2000.0000 mg | Freq: Once | INTRAVENOUS | Status: AC
  Administered 2022-06-26: 2000 mg via INTRAVESICAL
  Filled 2022-06-26: qty 2000

## 2022-06-26 MED ORDER — ONDANSETRON HCL 4 MG/2ML IJ SOLN: 4.0000 mg | Freq: Once | INTRAMUSCULAR | Status: DC | PRN

## 2022-06-26 MED ORDER — FENTANYL CITRATE (PF) 250 MCG/5ML IJ SOLN
INTRAMUSCULAR | Status: DC | PRN
  Administered 2022-06-26: 50 ug via INTRAVENOUS

## 2022-06-26 MED ORDER — ONDANSETRON HCL 4 MG/2ML IJ SOLN
INTRAMUSCULAR | Status: DC | PRN
  Administered 2022-06-26: 4 mg via INTRAVENOUS

## 2022-06-26 MED ORDER — OXYCODONE HCL 5 MG PO TABS
ORAL_TABLET | ORAL | Status: AC
  Filled 2022-06-26: qty 1

## 2022-06-26 MED ORDER — CEFAZOLIN SODIUM-DEXTROSE 2-4 GM/100ML-% IV SOLN
INTRAVENOUS | Status: AC
  Filled 2022-06-26: qty 100

## 2022-06-26 MED ORDER — STERILE WATER FOR IRRIGATION IR SOLN
Status: DC | PRN
  Administered 2022-06-26: 3000 mL

## 2022-06-26 MED ORDER — OXYCODONE HCL 5 MG PO TABS
5.0000 mg | ORAL_TABLET | Freq: Once | ORAL | Status: AC
  Administered 2022-06-26: 5 mg via ORAL

## 2022-06-26 MED ORDER — PROPOFOL 10 MG/ML IV BOLUS
INTRAVENOUS | Status: AC
  Filled 2022-06-26: qty 20

## 2022-06-26 MED ORDER — DEXAMETHASONE SODIUM PHOSPHATE 10 MG/ML IJ SOLN
INTRAMUSCULAR | Status: DC | PRN
  Administered 2022-06-26: 5 mg via INTRAVENOUS

## 2022-06-26 MED ORDER — FENTANYL CITRATE (PF) 100 MCG/2ML IJ SOLN
25.0000 ug | INTRAMUSCULAR | Status: DC | PRN
  Administered 2022-06-26 (×2): 25 ug via INTRAVENOUS

## 2022-06-26 MED ORDER — MIDAZOLAM HCL 2 MG/2ML IJ SOLN
INTRAMUSCULAR | Status: AC
  Filled 2022-06-26: qty 2

## 2022-06-26 MED ORDER — MIDAZOLAM HCL 2 MG/2ML IJ SOLN
INTRAMUSCULAR | Status: DC | PRN
  Administered 2022-06-26: 1 mg via INTRAVENOUS

## 2022-06-26 MED ORDER — LIDOCAINE 2% (20 MG/ML) 5 ML SYRINGE
INTRAMUSCULAR | Status: DC | PRN
  Administered 2022-06-26: 50 mg via INTRAVENOUS

## 2022-06-26 MED ORDER — CEPHALEXIN 500 MG PO CAPS: 500.0000 mg | ORAL_CAPSULE | Freq: Two times a day (BID) | ORAL | 0 refills | Status: AC

## 2022-06-26 SURGICAL SUPPLY — 19 items
BAG DRAIN URO-CYSTO SKYTR STRL (DRAIN) ×2 IMPLANT
BAG DRN UROCATH (DRAIN) ×2
CATH SILICONE 16FRX5CC (CATHETERS) IMPLANT
CATH URET 5FR 28IN OPEN ENDED (CATHETERS) IMPLANT
CLOTH BEACON ORANGE TIMEOUT ST (SAFETY) ×2 IMPLANT
ELECT REM PT RETURN 9FT ADLT (ELECTROSURGICAL) ×2
ELECTRODE REM PT RTRN 9FT ADLT (ELECTROSURGICAL) ×2 IMPLANT
GLOVE BIOGEL PI IND STRL 7.5 (GLOVE) IMPLANT
GLOVE SURG SS PI 7.5 STRL IVOR (GLOVE) IMPLANT
GOWN STRL REUS W/TWL LRG LVL3 (GOWN DISPOSABLE) ×2 IMPLANT
GUIDEWIRE ZIPWRE .038 STRAIGHT (WIRE) IMPLANT
HIBICLENS CHG 4% 4OZ (MISCELLANEOUS) IMPLANT
KIT TURNOVER CYSTO (KITS) ×2 IMPLANT
MANIFOLD NEPTUNE II (INSTRUMENTS) ×2 IMPLANT
PACK CYSTO (CUSTOM PROCEDURE TRAY) ×2 IMPLANT
STENT URET 6FRX24 CONTOUR (STENTS) IMPLANT
SYR 20ML LL LF (SYRINGE) IMPLANT
TUBE CONNECTING 12X1/4 (SUCTIONS) ×2 IMPLANT
WATER STERILE IRR 3000ML UROMA (IV SOLUTION) ×2 IMPLANT

## 2022-06-26 NOTE — Anesthesia Preprocedure Evaluation (Addendum)
Anesthesia Evaluation  Patient identified by MRN, date of birth, ID band Patient awake    Reviewed: Allergy & Precautions, NPO status , Patient's Chart, lab work & pertinent test results  History of Anesthesia Complications (+) PONV and history of anesthetic complications  Airway Mallampati: II  TM Distance: >3 FB Neck ROM: Full    Dental no notable dental hx. (+) Teeth Intact, Dental Advisory Given   Pulmonary former smoker   Pulmonary exam normal breath sounds clear to auscultation       Cardiovascular Normal cardiovascular exam Rhythm:Regular Rate:Normal     Neuro/Psych    GI/Hepatic ,GERD  ,,  Endo/Other    Renal/GU    Bladder CA    Musculoskeletal   Abdominal   Peds  Hematology   Anesthesia Other Findings R Breast CA  All: Latex, betadine, Morphine  Reproductive/Obstetrics                             Anesthesia Physical Anesthesia Plan  ASA: 3  Anesthesia Plan: General   Post-op Pain Management: Tylenol PO (pre-op)*   Induction: Intravenous  PONV Risk Score and Plan: 4 or greater and Treatment may vary due to age or medical condition, Ondansetron, Scopolamine patch - Pre-op, Dexamethasone, TIVA and Propofol infusion  Airway Management Planned: LMA  Additional Equipment: None  Intra-op Plan:   Post-operative Plan:   Informed Consent: I have reviewed the patients History and Physical, chart, labs and discussed the procedure including the risks, benefits and alternatives for the proposed anesthesia with the patient or authorized representative who has indicated his/her understanding and acceptance.     Dental advisory given  Plan Discussed with: CRNA, Anesthesiologist and Surgeon  Anesthesia Plan Comments: (TIVA LMA)       Anesthesia Quick Evaluation

## 2022-06-26 NOTE — Transfer of Care (Signed)
Immediate Anesthesia Transfer of Care Note  Patient: Whitney Boyd  Procedure(s) Performed: CYSTOSCOPY, BLADDER BIOPSY WITH GEMCITABINE INSTILLATION (Bladder) CYSTOSCOPY WITH FULGERATION (Bladder) CYSTOSCOPY WITH BILATERAL RETROGRADE PYELOGRAM/ LEFT URETERAL STENT PLACEMENT (Ureter)  Patient Location: PACU  Anesthesia Type:General  Level of Consciousness: drowsy and patient cooperative  Airway & Oxygen Therapy: Patient Spontanous Breathing  Post-op Assessment: Report given to RN and Post -op Vital signs reviewed and stable  Post vital signs: Reviewed and stable  Last Vitals:  Vitals Value Taken Time  BP    Temp    Pulse    Resp 14 06/26/22 0924  SpO2    Vitals shown include unvalidated device data.  Last Pain:  Vitals:   06/26/22 0646  TempSrc: Oral  PainSc: 5       Patients Stated Pain Goal: 4 (88/82/80 0349)  Complications: No notable events documented.

## 2022-06-26 NOTE — H&P (Signed)
Office Visit Report     06/17/2022   --------------------------------------------------------------------------------   Whitney Boyd  MRN: [id number]  DOB: Jul 17, 1945, 77 year old Female  PRIMARY CARE:     REFERRING:  Oswald Pott A. Lovena Neighbours, MD  PROVIDER:  Ellison Hughs, M.D.  LOCATION:  [facility] [phone]     --------------------------------------------------------------------------------   CC/HPI: CC: bladder cancer   HPI: The patient is a 76 year old female, previously followed by Dr. 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 She has completed induction BCG as well as 2 maintenance courses of BCG   Last dose of BCG-02/11/17   06/04/21: The patient is here today for a routine follow-up and surveillance cystoscopy. She is voiding w/o difficulty and denies interval UTIs, dysuria or hematuria. She is dealing with vertigo/inner ear issues.   04/12/22: She presents today with pelvic discomfort and dysuria associated with low back pain. Recent UAs at urgent care and at her PCPs office were negative. She was treated with a 5 day empiric course of keflex with no improvement in her symptoms. She is also using AZO with marginal improvement. Regular BMs. She denies hematuria, flank pain, fever/chills or dysuria. Drinks 5 cups of coffee daily.   06/17/22: Patient is here today for a routine follow-up. She notes that her pelvic discomfort noted at her last visit has since resolved. She feels that it was likely secondary to vaginitis and not a UTI. No longer using Uribel or AZO. She denies interval UTIs, dysuria or hematuria.     ALLERGIES: Betadine Iodine Latex Morphine Derivatives    MEDICATIONS: Benadryl TABS Oral  Gabapentin 1 PO Daily  Uribel 118 mg-10 mg-40.8 mg-36 mg-0.12 mg capsule 1 capsule PO Q 8 H PRN     GU PSH: Bladder Instill AntiCA Agent - 2018, 2018, 2018, 2017, 2017, 2017 Cystoscopy -  04/12/2022, 06/04/2021, 2021, 2021, 2020, 2019, 2019, 2018, 2018, 2018, 2017, 2017 Cystoscopy Insert Stent - 2017 Cystoscopy TURBT >5 cm - 2017       PSH Notes: Cystoscopy With Fulguration Large Lesion (Over 5cm), Cystoscopy With Insertion Of Ureteral Stent Left, Eye Surgery, Breast Surgery Lumpectomy, Cholecystectomy   NON-GU PSH: Cholecystectomy (open) - 2016 Remove Breast Lesion - 2016     GU PMH: Dysuria - 04/12/2022 Pelvic/perineal pain - 04/12/2022 History of bladder cancer - 06/04/2021, - 2021, - 2021 Bladder Cancer Trigone - 2019, - 2018, - 2018, - 2018, - 2017, - 2017 Bladder Cancer, Unspec - 2019, - 2018, - 2018, - 2018, - 2017 Bladder Cancer Lateral, Malignant neoplasm of lateral wall of urinary bladder - 2017 Bladder, Neoplasm of Unspecified behavior, Bladder tumor - 2016 Personal Hx Urinary Tract Infections, History of UTI - 2016 Urinary Tract Inf, Unspec site, UTI (urinary tract infection) - 2016    NON-GU PMH: Breast Cancer, History, History of breast cancer - 2016 Encounter for general adult medical examination without abnormal findings, Encounter for preventive health examination - 2016 Encounter for procedure for purposes other than remedying health state, unspecified, Surgery, elective - 2016    FAMILY HISTORY: malignant neoplasm of breast - Runs In Family Myocardial Infarction - Runs In Family   SOCIAL HISTORY: Marital Status: Single Preferred Language: English; Race: White Current Smoking Status: Patient does not smoke anymore. Has not smoked since 07/16/2015.  Types of alcohol consumed: Beer. Social Drinker.  Drinks 4+ caffeinated drinks per day. Patient's occupation is/was Retired.     Notes: Caffeine use, Single, Father deceased,  Retired, Number of children, Mother deceased, Alcohol use   REVIEW OF SYSTEMS:    GU Review Female:   Patient denies frequent urination, hard to postpone urination, burning /pain with urination, get up at night to urinate, leakage  of urine, stream starts and stops, trouble starting your stream, have to strain to urinate, and being pregnant.  Gastrointestinal (Upper):   Patient denies nausea, vomiting, and indigestion/ heartburn.  Gastrointestinal (Lower):   Patient denies diarrhea and constipation.  Constitutional:   Patient denies fever, night sweats, weight loss, and fatigue.  Skin:   Patient denies skin rash/ lesion and itching.  Eyes:   Patient denies blurred vision and double vision.  Ears/ Nose/ Throat:   Patient denies sore throat and sinus problems.  Hematologic/Lymphatic:   Patient denies easy bruising and swollen glands.  Cardiovascular:   Patient denies leg swelling and chest pains.  Respiratory:   Patient denies cough and shortness of breath.  Endocrine:   Patient denies excessive thirst.  Musculoskeletal:   Patient denies back pain and joint pain.  Neurological:   Patient denies headaches and dizziness.  Psychologic:   Patient denies depression and anxiety.   VITAL SIGNS:      06/17/2022 08:09 AM  Weight 165 lb / 74.84 kg  Height 61 in / 154.94 cm  BP 160/0 mmHg  Pulse 68 /min  Temperature 97.1 F / 36.1 C  BMI 31.2 kg/m   GU PHYSICAL EXAMINATION:    Urethral Meatus: Normal size. Normal position. No discharge.  Urethra: No tenderness, no mass, no scarring. No hypermobility. No leakage.   MULTI-SYSTEM PHYSICAL EXAMINATION:    Constitutional: Well-nourished. No physical deformities. Normally developed. Good grooming.  Neurologic / Psychiatric: Oriented to time, oriented to place, oriented to person. No depression, no anxiety, no agitation.     PAST DATA REVIEW: None   PROCEDURES:         Flexible Cystoscopy - 52000  Risks, benefits, and some of the potential complications of the procedure were discussed at length with the patient including infection, bleeding, voiding discomfort, urinary retention, fever, chills, sepsis, and others. All questions were answered. Informed consent was obtained.  Antibiotic prophylaxis was given. Sterile technique and intraurethral analgesia were used.  Meatus:  Normal size. Normal location. Normal condition.  Urethra:  No hypermobility. No leakage.  Ureteral Orifices:  Normal location. Normal size. Normal shape. Effluxed clear urine.  Bladder:  No trabeculation. No tumors. Normal mucosa. No stones. Slightly papillary looking mucosal folds involving previous biopsy site medial to the left ureteral orifice      The lower urinary tract was carefully examined. The procedure was well-tolerated and without complications. Antibiotic instructions were given. Instructions were given to call the office immediately for bloody urine, difficulty urinating, urinary retention, painful or frequent urination, fever, chills, nausea, vomiting or other illness. The patient stated that she understood these instructions and would comply with them.         Urinalysis w/Scope Dipstick Dipstick Cont'd Micro  Color: Yellow Bilirubin: Neg mg/dL WBC/hpf: 6 - 10/hpf  Appearance: Slightly Cloudy Ketones: Neg mg/dL RBC/hpf: NS (Not Seen)  Specific Gravity: 1.010 Blood: Neg ery/uL Bacteria: NS (Not Seen)  pH: 6.0 Protein: Neg mg/dL Cystals: Ca Oxalate  Glucose: Neg mg/dL Urobilinogen: 0.2 mg/dL Casts: NS (Not Seen)    Nitrites: Neg Trichomonas: Not Present    Leukocyte Esterase: 1+ leu/uL Mucous: Not Present      Epithelial Cells: 10 - 20/hpf      Yeast: NS (  Not Seen)      Sperm: Not Present    Notes: some transitional; epithellium seen    ASSESSMENT:      ICD-10 Details  1 GU:   Bladder, Neoplasm of Unspecified behavior - D49.4 Undiagnosed New Problem   PLAN:           Orders Labs Urine Culture          Schedule Return Visit/Planned Activity: Next Available Appointment - Schedule Surgery          Document Letter(s):  Created for Patient: Clinical Summary         Notes:    -Cystoscopy today revealed a slightly papillary area of mucosal folds involving a  previous biopsy site adjacent to the left ureteral orifice concerning for possible early signs of UCC recurrence. Plan for cystoscopy with bladder biopsy and fulguration with gemcitabine instillation. Risk, benefits and alternatives discussed. She voices understanding and wishes to proceed.

## 2022-06-26 NOTE — Anesthesia Procedure Notes (Signed)
Procedure Name: LMA Insertion Date/Time: 06/26/2022 8:42 AM  Performed by: Clearnce Sorrel, CRNAPre-anesthesia Checklist: Patient identified, Emergency Drugs available, Suction available and Patient being monitored Patient Re-evaluated:Patient Re-evaluated prior to induction Oxygen Delivery Method: Circle System Utilized Preoxygenation: Pre-oxygenation with 100% oxygen Induction Type: IV induction Ventilation: Mask ventilation without difficulty LMA: LMA inserted LMA Size: 4.0 Number of attempts: 1 Airway Equipment and Method: Bite block Placement Confirmation: positive ETCO2 Tube secured with: Tape Dental Injury: Teeth and Oropharynx as per pre-operative assessment

## 2022-06-26 NOTE — Discharge Instructions (Signed)

## 2022-06-26 NOTE — Op Note (Signed)
Operative Note  Preoperative diagnosis:  1.  5 mm erythematous bladder lesion adjacent to the left ureteral orifice 2.  History of bladder cancer  Postoperative diagnosis: 1.  5 mm erythematous bladder lesion adjacent to the left ureteral orifice 2.  History of bladder cancer  Procedure(s): 1.  Cystoscopy with bladder biopsy and fulguration 2.  Left ureteral stent placement 3.  Bilateral retrograde pyelogram with intraoperative interpretation fluoroscopic imaging 4.  Intravesical instillation of gemcitabine  Surgeon: Ellison Hughs, MD  Assistants:  None  Anesthesia:  General  Complications:  None  EBL: less than 5 mL  Specimens: 1.  Left posterior bladder wall biopsy  Drains/Catheters: 1.  Left 6 French, 24 cm JJ stent without tether 2.  16 French latex free Foley catheter  Intraoperative findings:   Slightly papillary and erythematous lesion adjacent to the left ureteral orifice Solitary left collecting system with no filling defects or dilation involving the left ureter or left renal pelvis seen on retrograde pyelogram Solitary right collecting system with no filling defects or dilation involving the right ureter or right renal pelvis seen on retrograde pyelogram  Indication:  Whitney Boyd is a 76 y.o. female with a history of high-grade TA carcinoma of the bladder that was diagnosed in 2017.  During recent surveillance cystoscopy, she was found to have an erythematous lesion adjacent to the left ureteral orifice, raising concern for urothelial carcinoma recurrence.  She has been consented for the above procedures, voices understanding and wishes to proceed.  Description of procedure:  After informed consent was obtained, the patient was brought to the operating room and general LMA anesthesia was administered. The patient was then placed in the dorsolithotomy position and prepped and draped in the usual sterile fashion. A timeout was performed. A 23 French rigid  cystoscope was then inserted into the urethral meatus and advanced into the bladder under direct vision. A complete bladder survey revealed the findings listed above.  Cold cup biopsy forceps were then used to carefully biopsy the erythematous lesion in question that was immediately adjacent to the left ureteral orifice.  The area that was biopsied was then extensively fulgurated until hemostasis was achieved.  Due to the close proximity of the biopsy and area of fulguration, made decision to place a ureteral stent to safeguard the intravesical component of the left ureter.  A 5 French ureteral catheter was then inserted into the left ureteral orifice and a retrograde pyelogram was obtained, with the findings listed above.  A Glidewire was then used to intubate the lumen of the ureteral catheter and was advanced up to the left renal pelvis, under fluoroscopic guidance.  The catheter was then removed, leaving the wire in place.  A 6 French, 24 cm JJ stent was then advanced over the wire and into good position within the left collecting system, confirming placement via fluoroscopy.  A 5 French ureteral catheter was then inserted into the right ureteral orifice and a retrograde pyelogram was obtained, with the findings listed above.  A latex free Foley catheter was then inserted into the bladder placed to gravity drainage.  The patient tolerated the procedure well and was transferred to the postanesthesia in stable condition.    While in the recovery room 2000 mg of gemcitabine in 50 mL of water was instilled in the bladder through the catheter and the catheter was plugged. This will remain indwelling for approximately one hour. It will then be drained from the bladder and the catheter will be removed  and the patient discharged home.  Plan: Keep gemcitabine within the bladder for 1 hour and then remove Foley catheter.  Discharge home.

## 2022-06-26 NOTE — Anesthesia Postprocedure Evaluation (Signed)
Anesthesia Post Note  Patient: Ace Gins  Procedure(s) Performed: CYSTOSCOPY, BLADDER BIOPSY WITH GEMCITABINE INSTILLATION (Bladder) CYSTOSCOPY WITH FULGERATION (Bladder) CYSTOSCOPY WITH BILATERAL RETROGRADE PYELOGRAM/ LEFT URETERAL STENT PLACEMENT (Ureter)     Patient location during evaluation: PACU Anesthesia Type: General Level of consciousness: awake and alert Pain management: pain level controlled Vital Signs Assessment: post-procedure vital signs reviewed and stable Respiratory status: spontaneous breathing, nonlabored ventilation, respiratory function stable and patient connected to nasal cannula oxygen Cardiovascular status: blood pressure returned to baseline and stable Postop Assessment: no apparent nausea or vomiting Anesthetic complications: no  No notable events documented.  Last Vitals:  Vitals:   06/26/22 1030 06/26/22 1045  BP: (!) 141/68 (!) 145/63  Pulse: (!) 59 (!) 53  Resp: 13 19  Temp:    SpO2: 97% 99%    Last Pain:  Vitals:   06/26/22 1000  TempSrc:   PainSc: [place]

## 2022-06-27 ENCOUNTER — Encounter (HOSPITAL_BASED_OUTPATIENT_CLINIC_OR_DEPARTMENT_OTHER): Payer: Self-pay | Admitting: Urology

## 2022-06-27 LAB — SURGICAL PATHOLOGY

## 2023-01-21 ENCOUNTER — Other Ambulatory Visit: Payer: Self-pay | Admitting: Physician Assistant

## 2023-01-21 DIAGNOSIS — Z1231 Encounter for screening mammogram for malignant neoplasm of breast: Secondary | ICD-10-CM

## 2023-03-03 ENCOUNTER — Ambulatory Visit: Payer: Medicare Other

## 2023-03-11 ENCOUNTER — Ambulatory Visit: Admission: RE | Admit: 2023-03-11 | Payer: Medicare Other | Source: Ambulatory Visit

## 2023-03-11 DIAGNOSIS — Z1231 Encounter for screening mammogram for malignant neoplasm of breast: Secondary | ICD-10-CM

## 2024-02-09 ENCOUNTER — Other Ambulatory Visit: Payer: Self-pay | Admitting: Physician Assistant

## 2024-02-09 DIAGNOSIS — Z1231 Encounter for screening mammogram for malignant neoplasm of breast: Secondary | ICD-10-CM

## 2024-03-12 ENCOUNTER — Ambulatory Visit
Admission: RE | Admit: 2024-03-12 | Discharge: 2024-03-12 | Disposition: A | Discharge status: Home / Self Care | Source: Ambulatory Visit | Attending: Physician Assistant | Admitting: Physician Assistant

## 2024-03-12 DIAGNOSIS — Z1231 Encounter for screening mammogram for malignant neoplasm of breast: Secondary | ICD-10-CM

## 2024-08-18 NOTE — Progress Notes (Unsigned)
 [facility] CONSULT NOTE  Patient Care Team: Boyd, Kirstin, PA-C as PCP - General (Family Medicine)  ASSESSMENT & PLAN:  Whitney Boyd is a 79 y.o.female with history of bladder cancer, breast cancer, GERD being [facility].  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  She completed course of BCG induction and 2 maintenance course of BCG. She has several years of remission.  She was then found to have bladder tumor recurrence and underwent cystoscopy with bladder biopsy in 06/26/2022.  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 Per history and patient reports, no recurrence on follow up cystoscopy since then as she completed BCG induction and 3 maintenance of BCG. She has difficulty due to recurrent UTI and asked for antibiotics. We discussed there is no clear indication for next line of treatment at this time as she has no recurrence. Although she may be BCG intolerant, risk of adding other agents maybe outweigh the benefit. I also don't have Anktiva here. Systemic immunotherapy would result in 15% +/- life changing AE with limited benefit and would not be indicated at this time. I recommend continue follow up with Whitney Boyd and monitor with cystoscopy if she cannot continue BCG. Ideally to complete 3 years of maintenance for high risk disease.  In the future, alternatively would be cystectomy (for cure, which she declined ileal conduit but would consider neobladder), intravesical gem/doce, TAR-200, or other centers for Anktiva, nadofaragene.   At the end of the visit, I prescribed her cipro  for 5 days. Recommend patient to follow up with Whitney Boyd for urine culture as this will allow him to know if she may or may not have certain antibiotics resistant UTI for future reference as she continues her cystoscopy and BCG treatment. She is welcome to see us  again. Assessment & Plan Malignant neoplasm of urinary bladder, unspecified site  (HCC) Continue BCG maintenance as able If cannot tolerate, continue surveillance cystoscopy is reasonable If recurring or BCG unresponsive, may consider alternative modalities as above. Acute cystitis without hematuria Cipro  bid for 5 days Urine culture at Whitney Boyd office so he can compare susceptibility for future references if having recurrent UTI.  All questions were answered. The patient knows to call the clinic with any problems, questions or concerns. No barriers to learning was detected.  Whitney JAYSON Chihuahua, MD 2/6/202610:26 PM  CHIEF COMPLAINTS/PURPOSE OF CONSULTATION:  Bladder cancer  HISTORY OF PRESENTING ILLNESS:  Whitney Boyd 79 y.o. female is here because of bladder cancer.  I have reviewed her chart and materials related to her cancer extensively and collaborated history with the patient. Summary of oncologic history is as follows:  Records show patient has a history of high-grade TA UC TURBT in January 2017.  She completed course of BCG induction and 2 maintenance course of BCG.  She was then found to have bladder tumor recurrence and underwent cystoscopy with bladder biopsy in 06/26/2022.  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  10/31/22 at follow up reported complete induction BCG.  02/2023 completed first maintenance BCG and follow up cysto.  CT from 05/28/2023 showed no evidence of residual recurrent bladder cancer.  No metastatic disease.  Nonobstructive punctate right nephrolithiasis. Right breast low-density lesion likely represent postop scarring.  06/16/23 completed 2nd maintenance BCG and follow up cysto, treated for UTI.  10/2023 completed 3rd maintenance BCG and treated for UTI  01/2024 follow up cysto  06/2024 recurrent UTI. Held BCG  Cytology  from 02/2023, 01/15/2024 and 06/2024 were negative for high-grade urothelial carcinoma.  Per urology note, last cystoscopy was December 2025.  Report negative for abnormalities.  Patient reports last  few BCG has been difficulty after the last dose because of recurrent UTI. She watched TV and heard Anktiva and brought to Whitney Boyd and was referred here. She thought we have this treatment. He has burning pain, increase frequency, and symptoms of UTI. Report she has a few days of antibiotics and was not enough and developed more UTI symptoms. She asked for an antibiotics. No fever or lower back pain. No GI symptoms.  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 +,  PR-,  HER 2+)--  s/p  right lumpectomy w/ node dissection and chemoradiation/   finished tamoxifen  2014   Lesion of bladder    Personal history of chemotherapy    completed 2008 right breast cancer   Personal history of radiation therapy    completed 2008  right breast cancer   PONV (postoperative nausea and vomiting)    pt states wearing patch behind ear has helped    Primary insomnia     SURGICAL HISTORY: Past Surgical History:  Procedure Laterality Date   BREAST LUMPECTOMY Right 07/2006   BREAST LUMPECTOMY WITH NEEDLE LOCALIZATION AND AXILLARY SENTINEL LYMPH NODE BX Right 08/13/2006   quadrantectomy    CATARACT EXTRACTION W/ INTRAOCULAR LENS  IMPLANT, BILATERAL Bilateral    left 2012;   right 2016   CHOLECYSTECTOMY  10/10/2011   Procedure: LAPAROSCOPIC CHOLECYSTECTOMY WITH INTRAOPERATIVE CHOLANGIOGRAM;  Surgeon: Whitney A. Cornett, MD;  Location: [facility];  Service: General;  Laterality: N/A;   CYSTOSCOPY W/ RETROGRADES Bilateral 07/25/2015   Procedure: CYSTOSCOPY WITH RETROGRADE PYELOGRAM;  Surgeon: Whitney Lynwood Napoleon, MD;  Location: [facility];  Service: Urology;  Laterality: Bilateral;   CYSTOSCOPY W/ URETERAL STENT PLACEMENT  06/26/2022   Procedure: CYSTOSCOPY WITH BILATERAL RETROGRADE PYELOGRAM/ LEFT URETERAL STENT PLACEMENT;  Surgeon: Whitney Boyd Lonni Righter, MD;  Location: [facility];  Service: Urology;;   CYSTOSCOPY WITH BIOPSY N/A 06/26/2022   Procedure: CYSTOSCOPY, BLADDER BIOPSY WITH GEMCITABINE  INSTILLATION;  Surgeon: Whitney Boyd Lonni Righter, MD;  Location[facility];  Service: Urology;  Laterality: N/A;  30 MINS   CYSTOSCOPY WITH FULGERATION N/A 06/26/2022   Procedure: CYSTOSCOPY WITH FULGERATION;  Surgeon: Whitney Boyd Lonni Righter, MD;  Location: [facility];  Service: Urology;  Laterality: N/A;   CYSTOSCOPY WITH STENT PLACEMENT Left 07/25/2015   Procedure: CYSTOSCOPY WITH STENT PLACEMENT;  Surgeon: Whitney Lynwood Napoleon, MD;  Location: [facility];  Service: Urology;  Laterality: Left;   HYSTEROSCOPY WITH D & C  06/22/2009   PORT A CATH PLACEMENT/  REMOVAL     09-11-2006/   12-18-2007   RETINOPATHY SURGERY Right 12/2014   RIGHT EYE   TONSILLECTOMY  1946   age 71   TRANSURETHRAL RESECTION OF BLADDER TUMOR WITH GYRUS (TURBT-GYRUS) N/A 07/25/2015   Procedure: TRANSURETHRAL RESECTION OF BLADDER TUMOR WITH GYRUS (TURBT-GYRUS);  Surgeon: Whitney Lynwood Napoleon, MD;  Location: [facility];  Service: Urology;  Laterality: N/A;    SOCIAL HISTORY: Social History   Socioeconomic History   Marital status: Divorced    Spouse name: Not on file   Number of children: 2   Years of education: Not on file   Highest education level: Not on file  Occupational History    Employer: [employer]  Tobacco Use   Smoking status: Former    Current packs/day: 0.00    Types: Cigarettes    Start date: 2012    Quit date: 2017    Years since quitting: 9.1   Smokeless tobacco: Never  Vaping Use   Vaping status: Never Used  Substance and Sexual  Activity   Alcohol use: Yes    Comment: occasional beer   Drug use: Never   Sexual activity: Not on file  Other Topics Concern   Not on file  Social History Narrative   Not on file   Social Drivers of Health   Tobacco Use: Medium Risk (07/12/2024)   Received from [facility]   Patient History    Smoking Tobacco Use: Former    Smokeless Tobacco Use: Never    Passive Exposure: Not on file  Financial Resource Strain: Low Risk (07/12/2024)   Received from [facility]   Overall Financial Resource Strain (CARDIA)    How hard is it for you to pay for the very basics like food, housing, medical care, and heating?: Not hard at all  Food Insecurity: No Food Insecurity (08/20/2024)   Epic    Worried About Radiation Protection Practitioner of Food in the Last Year: Never true    Ran Out of Food in the Last Year: Never true  Transportation Needs: No Transportation Needs (08/20/2024)   Epic    Lack of Transportation (Medical): No    Lack of Transportation (Non-Medical): No  Physical Activity: Inactive (07/12/2024)   Received from [facility]   Exercise Vital Sign    On average, how many days per week do you engage in moderate to strenuous exercise (like a brisk walk)?: 0 days    Minutes of Exercise per Session: Not on file  Stress: No Stress Concern Present (07/12/2024)   Received from [facility] - Occupational Stress Questionnaire    Do you feel stress - tense, restless, nervous, or anxious, or unable to sleep at night because your mind is troubled all the time - these days?: Not at all  Social Connections: Socially Integrated (07/12/2024)   Received from [facility]   Social Network    How would you rate your social network (family, work, friends)?: Good participation with social networks  Intimate Partner Violence: Not At Risk (08/20/2024)   Epic    Fear of Current or Ex-Partner: No    Emotionally Abused: No    Physically Abused: No    Sexually Abused:  No  Depression (PHQ2-9): Low Risk (08/20/2024)   Depression (PHQ2-9)    PHQ-2 Score: 0  Alcohol Screen: Not on file  Housing: Low Risk (08/20/2024)   Epic    Unable to Pay for Housing in the Last Year: No    Number of Times Moved in the Last Year: 0    Homeless in the Last Year: No  Utilities: Not At Risk (08/20/2024)   Epic    Threatened with loss of utilities: No  Health Literacy: Not on file    FAMILY HISTORY: Family History  Problem Relation Age of Onset  Heart disease Mother    Breast cancer Mother 31   Heart attack Mother    Cancer Father 58       Bone; thought to be related to 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  MEDICATIONS:  Current Outpatient Medications  Medication Sig Dispense Refill   acetaminophen  (TYLENOL ) 500 MG tablet Take 500 mg by mouth every 6 (six) hours as needed.     Carboxymethylcellulose Sodium (DRY EYE RELIEF OP) Apply to eye as needed.     ciprofloxacin  (CIPRO ) 500 MG tablet Take 1 tablet (500 mg total) by mouth 2 (two) times daily. 10 tablet 0   Cranberry-Vitamin C-Probiotic (AZO CRANBERRY PO) Take by mouth as needed.     Cyanocobalamin (VITAMIN B-12 PO) Take by mouth daily.     diphenhydrAMINE  (BENADRYL ) 25 MG tablet Take 25 mg by mouth every 6 (six) hours as needed. For allergies     meloxicam (MOBIC) 15 MG tablet Take 15 mg by mouth daily as needed for pain.     Multiple Vitamins-Minerals (ONE-A-DAY WOMENS 50 PLUS) TABS Take 1 tablet by mouth daily.     Multiple Vitamins-Minerals (PRESERVISION AREDS 2) CAPS Take 2 capsules by mouth daily.     pantoprazole  (PROTONIX ) 40 MG tablet Take 40 mg by mouth 2 (two) times daily.     pseudoephedrine (SUDAFED) 120 MG 12 hr tablet Take 120 mg by mouth daily as needed for congestion.     meloxicam (MOBIC) 7.5 MG tablet Take 7.5 mg by mouth daily.      No current facility-administered medications for this visit.    REVIEW OF SYSTEMS:   All relevant systems were reviewed with the patient and are negative.  PHYSICAL EXAMINATION: ECOG PERFORMANCE STATUS: 0 - Asymptomatic  Vitals:   08/20/24 1223 08/20/24 1229  BP: (!) 160/64 (!) 161/69  Pulse: 71   Resp: 16   Temp: (!) 97.5 F (36.4 C)   SpO2: 99%    Filed Weights   08/20/24 1223  Weight: 172 lb (78 kg)    GENERAL: alert, no distress and comfortable SKIN: skin color is normal, no jaundice LUNGS: Effort normal, no respiratory distress  LABORATORY DATA:  I have reviewed the relevant labs results.  RADIOGRAPHIC STUDIES: Relevant imaging report rerviewed.

## 2024-08-20 ENCOUNTER — Inpatient Hospital Stay

## 2024-08-20 VITALS — BP 161/69 | HR 71 | Temp 97.5°F | Resp 16 | Ht 61.0 in | Wt 172.0 lb

## 2024-08-20 DIAGNOSIS — C679 Malignant neoplasm of bladder, unspecified: Secondary | ICD-10-CM

## 2024-08-20 DIAGNOSIS — N3000 Acute cystitis without hematuria: Secondary | ICD-10-CM

## 2024-08-20 MED ORDER — CIPROFLOXACIN HCL 500 MG PO TABS: 500.0000 mg | ORAL_TABLET | Freq: Two times a day (BID) | ORAL | 0 refills | Status: AC

## 2024-08-20 NOTE — Assessment & Plan Note (Addendum)
 Continue BCG maintenance as able If cannot tolerate, continue surveillance cystoscopy is reasonable If recurring or BCG unresponsive, may consider alternative modalities as above.
